# Patient Record
Sex: Male | Born: 1945 | Race: White | Hispanic: No | Marital: Married | Smoking: Never smoker
Health system: Southern US, Community
[De-identification: ages and names within clinical notes are randomized; demographics above are authoritative.]

## PROBLEM LIST (undated history)

## (undated) DIAGNOSIS — N201 Calculus of ureter: Secondary | ICD-10-CM

## (undated) DIAGNOSIS — Z9989 Dependence on other enabling machines and devices: Secondary | ICD-10-CM

## (undated) DIAGNOSIS — Z8639 Personal history of other endocrine, nutritional and metabolic disease: Secondary | ICD-10-CM

## (undated) DIAGNOSIS — J302 Other seasonal allergic rhinitis: Secondary | ICD-10-CM

## (undated) DIAGNOSIS — Z974 Presence of external hearing-aid: Secondary | ICD-10-CM

## (undated) DIAGNOSIS — E785 Hyperlipidemia, unspecified: Secondary | ICD-10-CM

## (undated) DIAGNOSIS — N200 Calculus of kidney: Secondary | ICD-10-CM

## (undated) DIAGNOSIS — M7550 Bursitis of unspecified shoulder: Secondary | ICD-10-CM

## (undated) DIAGNOSIS — Z8547 Personal history of malignant neoplasm of testis: Secondary | ICD-10-CM

## (undated) DIAGNOSIS — K219 Gastro-esophageal reflux disease without esophagitis: Secondary | ICD-10-CM

## (undated) DIAGNOSIS — E119 Type 2 diabetes mellitus without complications: Secondary | ICD-10-CM

## (undated) DIAGNOSIS — I10 Essential (primary) hypertension: Secondary | ICD-10-CM

## (undated) DIAGNOSIS — Z87442 Personal history of urinary calculi: Secondary | ICD-10-CM

## (undated) DIAGNOSIS — G4733 Obstructive sleep apnea (adult) (pediatric): Secondary | ICD-10-CM

## (undated) HISTORY — PX: PARATHYROIDECTOMY: SHX19

---

## 1979-08-17 HISTORY — PX: PERCUTANEOUS NEPHROSTOLITHOTOMY: SHX2207

## 1990-12-16 HISTORY — PX: ORCHIECTOMY: SHX2116

## 2006-12-16 HISTORY — PX: CATARACT EXTRACTION W/ INTRAOCULAR LENS  IMPLANT, BILATERAL: SHX1307

## 2011-05-14 ENCOUNTER — Emergency Department (HOSPITAL_COMMUNITY)

## 2011-05-14 ENCOUNTER — Emergency Department (HOSPITAL_COMMUNITY)
Admission: EM | Admit: 2011-05-14 | Discharge: 2011-05-14 | Disposition: A | Discharge status: Home / Self Care | Attending: Emergency Medicine | Admitting: Emergency Medicine

## 2011-05-14 DIAGNOSIS — R51 Headache: Secondary | ICD-10-CM | POA: Insufficient documentation

## 2011-05-14 DIAGNOSIS — I10 Essential (primary) hypertension: Secondary | ICD-10-CM | POA: Insufficient documentation

## 2011-05-14 DIAGNOSIS — M79609 Pain in unspecified limb: Secondary | ICD-10-CM | POA: Insufficient documentation

## 2011-05-14 DIAGNOSIS — R209 Unspecified disturbances of skin sensation: Secondary | ICD-10-CM | POA: Insufficient documentation

## 2011-05-14 DIAGNOSIS — Z79899 Other long term (current) drug therapy: Secondary | ICD-10-CM | POA: Insufficient documentation

## 2011-05-14 DIAGNOSIS — E7800 Pure hypercholesterolemia, unspecified: Secondary | ICD-10-CM | POA: Insufficient documentation

## 2011-05-14 LAB — COMPREHENSIVE METABOLIC PANEL
AST: 18 U/L (ref 0–37)
Albumin: 3.9 g/dL (ref 3.5–5.2)
Alkaline Phosphatase: 64 U/L (ref 39–117)
BUN: 17 mg/dL (ref 6–23)
Creatinine, Ser: 0.82 mg/dL (ref 0.4–1.5)
GFR calc Af Amer: >60 mL/min (ref >60)
Potassium: 3.6 mEq/L (ref 3.5–5.1)
Total Protein: 7 g/dL (ref 6.0–8.3)

## 2011-05-14 LAB — DIFFERENTIAL
Basophils Absolute: 0 10*3/uL (ref 0.0–0.1)
Basophils Relative: 0 % (ref 0–1)
Monocytes Absolute: 1 10*3/uL (ref 0.1–1.0)
Neutro Abs: 5.2 10*3/uL (ref 1.7–7.7)
Neutrophils Relative %: 61 % (ref 43–77)

## 2011-05-14 LAB — CBC
Hemoglobin: 15.8 g/dL (ref 13.0–17.0)
MCHC: 34.3 g/dL (ref 30.0–36.0)
WBC: 8.6 10*3/uL (ref 4.0–10.5)

## 2011-05-14 LAB — PROTIME-INR
INR: 0.96 (ref 0.00–1.49)
Prothrombin Time: 13 seconds (ref 11.6–15.2)

## 2011-05-14 MED ORDER — GADOBENATE DIMEGLUMINE 529 MG/ML IV SOLN
20.0000 mL | Freq: Once | INTRAVENOUS | Status: AC | PRN
  Administered 2011-05-14: 20 mL via INTRAVENOUS

## 2011-05-20 NOTE — Consult Note (Signed)
NAME:  Jerry Mcneil, TREAT NO.:  [id number]  MEDICAL RECORD NO.:  [id number]           PATIENT TYPE:  E  LOCATION:  [facility]                         FACILITY:  [facility]  PHYSICIAN:  Marlan Palau, M.D.  DATE OF BIRTH:  Dec 14, 1946  DATE OF CONSULTATION:  05/14/2011 DATE OF DISCHARGE:  05/14/2011                                CONSULTATION   HISTORY OF PRESENT ILLNESS:  Jerry Mcneil is a 65 year old right- handed white male born on 02-27-1946, with a history of hypertension and dyslipidemia.  This patient comes to the [facility] for evaluation of a possible stroke event.  This patient had onset of pain in the left arm and shoulder going up into the left neck associated with headache on the left side that began around 3:30 p.m. today.  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  The patient reported chest pains, shortness of breath as well.  The patient had pain down the left arm to the fingers, weakness of the left arm.  The patient went to the emergency room for an evaluation.  The patient claims while in the emergency room he developed some left facial weakness, slurred speech, left leg weakness and numbness and left leg pain.  The patient also complained of some dizziness.  This patient claims that since in the emergency room that his left-sided leg weakness has improved.  The patient continued to complain of left-sided chest pains, left neck pain, left arm pain, and pain in the left leg.  MRI of the brain has been done that by my reading appears to be completely unremarkable without acute changes seen.  MRA of the head and neck was done and appears to be completely normal.  This patient is being seen by Neurology for further evaluation.  PAST MEDICAL HISTORY:  Significant for: 1. History of new onset left-sided pain in the arm and leg with the     left hemisensory deficit with the face, arm, and  leg. 2. Diabetes, diet controlled. 3. Hypertension. 4. Dyslipidemia. 5. Decreased auditory acuity with hearing aid. 6. Parathyroidectomy. 7. Cataract surgery.  MEDICATIONS:  Notable for hydrochlorothiazide.  The patient is on a blood pressure pill he cannot remember the name of and is on a cholesterol medication he cannot remember the name of.  The patient is not on aspirin.  The patient notes an allergy to IVP DYE.  SOCIAL HISTORY:  This patient is married, [place], has three children who are alive and well.  The patient again works as a Orthoptist for [employer], does not smoke or drink.  FAMILY MEDICAL HISTORY:  Mother is living, age 67 with heart problems. Father died with cancer.  The patient has one brother who has had a pacemaker, one sister with degenerative arthritis and fibromyalgia, obesity.  REVIEW OF SYSTEMS:  Notable for no recent fevers, chills.  The patient does note some headache today and left neck pain, shortness of breath, chest pain is on the left.  The patient notes some nausea, no  vomiting, some loose bowels.  No problems controlling the bladder.  Denies any blackout episodes.  PHYSICAL EXAMINATION:  VITAL SIGNS:  Blood pressure is 113/70, heart rate is 59, respiratory rate 14, temperature afebrile. GENERAL:  The patient is a minimally obese white male who is alert and cooperative at the time of examination. HEENT:  Head is atraumatic.  Eyes:  Pupils are equal, round, and reactive to light. NECK:  Supple.  No carotid bruits noted. RESPIRATORY:  Clear. CARDIOVASCULAR:  Regular rate and rhythm.  No obvious murmurs, rubs noted. EXTREMITIES:  Without significant edema. NEUROLOGIC:  Cranial nerves as above.  Facial symmetry is present.  The patient has good sensation of the face on the cheeks bilaterally, decreased pinprick sensation on the left forehead is greater than right. Vibratory sensation is symmetric  on the forehead.  Again, extraocular movements are full, visual fields are full.  No aphasia or dysarthria is noted.  Motor testing reveals poor motor effort with the left arm and leg.  The patient has prominent pain displays when using the left arm and leg, slow movements with finger-nose-finger and heel-to-shin on the left and normal on the right.  The patient notes decreased pinprick sensation on left arm, left leg, it is greater on the right.  Vibration sensation is symmetric in the hands, decreased on the left foot as compared to the right.  The patient has some drift with the left arm and left legs.  On the right, the patient has symmetric reflexes.  Toes are neutral bilaterally.  The patient was not ambulated.  The patient reports some pain with elevation of the left leg and the left arm.  LABORATORY VALUES:  Notable for a white count of 8.6, hemoglobin of 15.8, hematocrit of 41.6, MCV of 90.6, platelets of 144, INR 0.96. Sodium 137, potassium 3.6, chloride 99, CO2 of 27, glucose 101, BUN 17, creatinine 0.82, calcium 9.9, total protein 7.0, albumin 3.9, AST of 18, ALT of 15.  The patient has a negative chest x-ray.  MRI and CT studies are as above.  Calcium level 9.9, albumin 3.9, total protein 7.0, INR of again 0.96.  IMPRESSION: 1. Left-sided pain syndrome, hemisensory deficit, weakness with     nonphysiologic examination. 2. Hypertension. 3. Diet controlled diabetes. 4. Dyslipidemia.  This patient does have some risk factors for stroke, but the MRI of the brain and MRA are unremarkable.  There is no evidence of a dissection of a vessel, and no evidence of stroke.  The patient now has had symptoms of left-sided weakness, numbness, and pain going on for over 7 hours with a normal MRI of the brain.  The patient likely has a nonphysiologic deficit.  The onset of the problem started with the left arm.  I suppose it is possible this patient may have sustained a nerve root  impingement syndrome in the neck and then later had embellishment of symptoms.  If the pain problem continues, I may consider an MRI of the cervical spine in the future.  At this point, from a neurologic standpoint, the patient is cleared for discharge home.  The patient can be followed through [facility] if needed.     Marlan Palau, M.D.     CKW/MEDQ  D:  05/14/2011  T:  05/15/2011  Job:  [id number]  cc:   Shanon Payor, MD [facility]  Electronically Signed by Thana Farr M.D. on 05/20/2011 09:30:53 AM

## 2012-12-16 HISTORY — PX: EXTRACORPOREAL SHOCK WAVE LITHOTRIPSY: SHX1557

## 2013-02-10 ENCOUNTER — Emergency Department (HOSPITAL_COMMUNITY): Payer: Medicare Other

## 2013-02-10 ENCOUNTER — Emergency Department (HOSPITAL_COMMUNITY)
Admission: EM | Admit: 2013-02-10 | Discharge: 2013-02-10 | Disposition: A | Discharge status: Home / Self Care | Payer: Medicare Other | Attending: Emergency Medicine | Admitting: Emergency Medicine

## 2013-02-10 ENCOUNTER — Encounter (HOSPITAL_COMMUNITY): Payer: Self-pay | Admitting: Nurse Practitioner

## 2013-02-10 DIAGNOSIS — I319 Disease of pericardium, unspecified: Secondary | ICD-10-CM | POA: Insufficient documentation

## 2013-02-10 DIAGNOSIS — R110 Nausea: Secondary | ICD-10-CM | POA: Insufficient documentation

## 2013-02-10 DIAGNOSIS — E785 Hyperlipidemia, unspecified: Secondary | ICD-10-CM | POA: Insufficient documentation

## 2013-02-10 DIAGNOSIS — F411 Generalized anxiety disorder: Secondary | ICD-10-CM | POA: Insufficient documentation

## 2013-02-10 DIAGNOSIS — R635 Abnormal weight gain: Secondary | ICD-10-CM | POA: Insufficient documentation

## 2013-02-10 DIAGNOSIS — R0602 Shortness of breath: Secondary | ICD-10-CM

## 2013-02-10 DIAGNOSIS — Z79899 Other long term (current) drug therapy: Secondary | ICD-10-CM | POA: Insufficient documentation

## 2013-02-10 DIAGNOSIS — R079 Chest pain, unspecified: Secondary | ICD-10-CM

## 2013-02-10 DIAGNOSIS — Z8639 Personal history of other endocrine, nutritional and metabolic disease: Secondary | ICD-10-CM | POA: Insufficient documentation

## 2013-02-10 DIAGNOSIS — E119 Type 2 diabetes mellitus without complications: Secondary | ICD-10-CM | POA: Insufficient documentation

## 2013-02-10 DIAGNOSIS — Z8679 Personal history of other diseases of the circulatory system: Secondary | ICD-10-CM | POA: Insufficient documentation

## 2013-02-10 DIAGNOSIS — I10 Essential (primary) hypertension: Secondary | ICD-10-CM | POA: Insufficient documentation

## 2013-02-10 DIAGNOSIS — R51 Headache: Secondary | ICD-10-CM | POA: Insufficient documentation

## 2013-02-10 DIAGNOSIS — Z87442 Personal history of urinary calculi: Secondary | ICD-10-CM | POA: Insufficient documentation

## 2013-02-10 DIAGNOSIS — Z862 Personal history of diseases of the blood and blood-forming organs and certain disorders involving the immune mechanism: Secondary | ICD-10-CM | POA: Insufficient documentation

## 2013-02-10 DIAGNOSIS — R0789 Other chest pain: Secondary | ICD-10-CM | POA: Insufficient documentation

## 2013-02-10 DIAGNOSIS — Z7982 Long term (current) use of aspirin: Secondary | ICD-10-CM | POA: Insufficient documentation

## 2013-02-10 HISTORY — DX: Hyperlipidemia, unspecified: E78.5

## 2013-02-10 HISTORY — DX: Other seasonal allergic rhinitis: J30.2

## 2013-02-10 HISTORY — DX: Essential (primary) hypertension: I10

## 2013-02-10 LAB — COMPREHENSIVE METABOLIC PANEL
ALT: 16 U/L (ref 0–53)
Alkaline Phosphatase: 49 U/L (ref 39–117)
CO2: 24 mEq/L (ref 19–32)
GFR calc Af Amer: 82 mL/min — ABNORMAL LOW (ref >90)
GFR calc non Af Amer: 71 mL/min — ABNORMAL LOW (ref >90)
Glucose, Bld: 121 mg/dL — ABNORMAL HIGH (ref 70–99)
Potassium: 4 mEq/L (ref 3.5–5.1)
Sodium: 142 mEq/L (ref 135–145)
Total Bilirubin: 0.5 mg/dL (ref 0.3–1.2)

## 2013-02-10 LAB — CBC WITH DIFFERENTIAL/PLATELET
Basophils Absolute: 0 10*3/uL (ref 0.0–0.1)
Basophils Relative: 0 % (ref 0–1)
MCHC: 33.6 g/dL (ref 30.0–36.0)
Monocytes Absolute: 0.7 10*3/uL (ref 0.1–1.0)
Neutro Abs: 4.4 10*3/uL (ref 1.7–7.7)
Neutrophils Relative %: 66 % (ref 43–77)
Platelets: 146 10*3/uL — ABNORMAL LOW (ref 150–400)
RDW: 13.9 % (ref 11.5–15.5)

## 2013-02-10 LAB — PROTIME-INR: INR: 1.02 (ref 0.00–1.49)

## 2013-02-10 LAB — URINALYSIS, ROUTINE W REFLEX MICROSCOPIC
Bilirubin Urine: NEGATIVE
Ketones, ur: NEGATIVE mg/dL
Nitrite: NEGATIVE
pH: 5.5 (ref 5.0–8.0)

## 2013-02-10 LAB — URINE MICROSCOPIC-ADD ON

## 2013-02-10 LAB — PRO B NATRIURETIC PEPTIDE: Pro B Natriuretic peptide (BNP): 264.1 pg/mL — ABNORMAL HIGH (ref 0–125)

## 2013-02-10 MED ORDER — GI COCKTAIL ~~LOC~~
30.0000 mL | Freq: Once | ORAL | Status: DC
  Filled 2013-02-10: qty 30

## 2013-02-10 MED ORDER — GI COCKTAIL ~~LOC~~
30.0000 mL | Freq: Once | ORAL | Status: AC
  Administered 2013-02-10: 30 mL via ORAL

## 2013-02-10 MED ORDER — ASPIRIN 81 MG PO CHEW
162.0000 mg | CHEWABLE_TABLET | Freq: Once | ORAL | Status: AC
  Administered 2013-02-10: 162 mg via ORAL
  Filled 2013-02-10: qty 1

## 2013-02-10 MED ORDER — MORPHINE SULFATE 4 MG/ML IJ SOLN
4.0000 mg | Freq: Once | INTRAMUSCULAR | Status: AC
  Administered 2013-02-10: 4 mg via INTRAVENOUS
  Filled 2013-02-10: qty 1

## 2013-02-10 NOTE — Progress Notes (Signed)
Support for [employer]. 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

## 2013-02-10 NOTE — ED Notes (Signed)
Removed supplemental oxygen and pt sating at 97% on RA.  Pt continuing to do calming breathing exercises.

## 2013-02-10 NOTE — ED Provider Notes (Signed)
History     CSN: [id number]  Arrival date & time 02/10/13  4098   First MD Initiated Contact with Patient 02/10/13 [phone]      Chief Complaint  Patient presents with  . 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  He states it started like he needed to belch.  He did not try tums. Then he had a sensation in his mid to left chest which was pressure like radiating to his left shoulder.  Pain 5/10.  He states he has bursitis of the left shoulder but this sensation is different.  Associated with sob at rest (new, while lying down), nausea without vomiting.  He took 2 baby Aspirin this morning.  He also reports increased weight gain. Normal weight is 199-205 pounds.    He reports h/a left side of head radiating down to his neck and left shoulder pain is 5/10.  He feels like someone knocked him in the head.    PMH: pericarditis (20s), kidney stone, HTN, dyslipidemia, DM, possible TIA/CVA FH: both grandfathers with MI, paternal grandfather died in 85s of MI, maternal GM MI, brother with heart problems Meds: simivastatin, HCTZ, enalapril, ASA 81 mg, Ibuprofen, Claritin  PCP: Dr. Anna Genre   The history is provided by the patient. No language interpreter was used.    Family History  Problem Relation Age of Onset  . Heart murmur Mother   . Cancer Father     History  Substance Use Topics  . Smoking status: Never Smoker   . Smokeless tobacco: Never Used  . Alcohol Use: No      Review of Systems  Constitutional: Positive for unexpected weight change.  Respiratory: Positive for shortness of breath.   Cardiovascular: Positive for chest pain. Negative for leg swelling.  Gastrointestinal: Negative for abdominal pain.  Genitourinary: Negative for dysuria.  Neurological: Positive for headaches.  Psychiatric/Behavioral: The patient is nervous/anxious.   All other systems reviewed and are  negative.    Allergies  Iohexol  Home Medications   Current Outpatient Rx  Name  Route  Sig  Dispense  Refill  . aspirin 81 MG tablet   Oral   Take 81 mg by mouth daily.         . enalapril (VASOTEC) 20 MG tablet   Oral   Take 20 mg by mouth daily.         . fexofenadine (ALLEGRA) 180 MG tablet   Oral   Take 180 mg by mouth daily.         . hydrochlorothiazide (HYDRODIURIL) 25 MG tablet   Oral   Take 25 mg by mouth daily.         Marland Kitchen ibuprofen (ADVIL,MOTRIN) 200 MG tablet   Oral   Take 200 mg by mouth every 6 (six) hours as needed for pain (pain).         . ranitidine (ZANTAC) 150 MG tablet   Oral   Take 150 mg by mouth daily as needed for heartburn.         . simvastatin (ZOCOR) 10 MG tablet   Oral   Take 10 mg by mouth at bedtime.           BP 117/75  Pulse 51  Temp(Src) 97.7 F (36.5 C) (Oral)  Resp 15  SpO2 98%  Physical Exam  Nursing note and vitals reviewed. Constitutional: He is oriented to  person, place, and time. Vital signs are normal. He appears well-developed and well-nourished. He is cooperative.  HENT:  Head: Normocephalic and atraumatic.  Mouth/Throat: Oropharynx is clear and moist and mucous membranes are normal. No oropharyngeal exudate.  Eyes: Conjunctivae are normal. Pupils are equal, round, and reactive to light. Right eye exhibits no discharge. Left eye exhibits no discharge. No scleral icterus.  Cardiovascular: Normal rate, regular rhythm, S1 normal, S2 normal and normal heart sounds.   No murmur heard. Pressure sensation increased with palpation  Pulmonary/Chest: Effort normal and breath sounds normal.  Abdominal: Soft. Bowel sounds are normal. He exhibits no distension. There is no tenderness.  Obese ab  Musculoskeletal: He exhibits no edema.       Left shoulder: He exhibits tenderness.  Left shoulder joint with mild ttp   Neurological: He is alert and oriented to person, place, and time. He has normal strength.   Skin: Skin is warm, dry and intact. No rash noted.  Psychiatric: He has a normal mood and affect. His speech is normal and behavior is normal. Judgment and thought content normal. Cognition and memory are normal.  Talkative     ED Course  Procedures (including critical care time)  Labs Reviewed  COMPREHENSIVE METABOLIC PANEL - Abnormal; Notable for the following:    Glucose, Bld 121 (*)    GFR calc non Af Amer 71 (*)    GFR calc Af Amer 82 (*)    All other components within normal limits  CBC WITH DIFFERENTIAL - Abnormal; Notable for the following:    Platelets 146 (*)    All other components within normal limits  PRO B NATRIURETIC PEPTIDE - Abnormal; Notable for the following:    Pro B Natriuretic peptide (BNP) 264.1 (*)    All other components within normal limits  URINALYSIS, ROUTINE W REFLEX MICROSCOPIC - Abnormal; Notable for the following:    Hgb urine dipstick SMALL (*)    Leukocytes, UA TRACE (*)    All other components within normal limits  URINE MICROSCOPIC-ADD ON - Abnormal; Notable for the following:    Bacteria, UA FEW (*)    All other components within normal limits  URINE CULTURE  TROPONIN I  PROTIME-INR  D-DIMER, QUANTITATIVE  TROPONIN I   Dg Chest 2 View  02/10/2013  *RADIOLOGY REPORT*  Clinical Data: Chest pain  CHEST - 2 VIEW  Comparison: 05/14/2011  Findings: There is no pleural effusion identified.  The lung volumes are decreased.  No pleural effusion or edema.  There is no airspace consolidation noted.  The review of the visualized osseous structures is unremarkable. Small hiatal hernia identified.  IMPRESSION:  1.  Low lung volumes. 2.  No pneumonia.   Original Report Authenticated By: Signa Kell, M.D.      1. Chest pain   2. SOB (shortness of breath)       Date: 02/10/2013  Rate: 60  Rhythm: normal sinus rhythm  QRS Axis: normal  Intervals: normal  ST/T Wave abnormalities: normal  Conduction Disutrbances:none  Narrative Interpretation:    Old EKG Reviewed: unchanged   MDM  Vitals on exam 57, 100%, 13, 137/70(85)  Chest pain with typical and atypical features with w/u for ACS/UA/angina, noncardiac (i.e GI/PE) Trop, bnp, CMET, cbc, inr, UA, d dimer TIMI Risk score at least 3 GI cocktail, Aspirin 162 as patient already took 162 mg today Symptoms relieved somewhat by GI cocktail and morphine Patient given the option for observation, patient declines he states he will  follow up with his PCP tomorrow Needs outpatient w/u i.e cardiac stress test, echo  [place] MD [phone]         Annett Gula, MD 02/10/13 1104  Annett Gula, MD 02/10/13 [phone]

## 2013-02-10 NOTE — ED Notes (Signed)
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  At 6AM, pt began feeling pressure in central chest that radiated to right shoulder: reports 5/10.

## 2013-02-10 NOTE — ED Provider Notes (Signed)
I saw and evaluated the patient, reviewed the resident's note and I agree with the findings and plan. I agree with resident's EKG interpretation.  Chest pressure radiating to L shoulder and back since 6 am.  Constant for 2.5 hours. Associated with SOB and nausea. Hx HTN, HLD. CTAB, RRR.  Chest wall and shoulder tender to palpation that duplicates symptoms. Equal grip strengths.  EKG nsr. Troponin negative.  Patient with some typical and atypical features for chest pain. Delta troponin negative. Recommend admission for observation rule out which patient declines stating he will follow up with his Dr. for stress test.  Glynn Octave, MD 02/10/13 1207

## 2013-02-10 NOTE — ED Notes (Signed)
Patient transported to X-ray 

## 2013-02-11 LAB — URINE CULTURE

## 2013-04-10 ENCOUNTER — Emergency Department (HOSPITAL_COMMUNITY): Payer: Medicare Other

## 2013-04-10 ENCOUNTER — Inpatient Hospital Stay (HOSPITAL_COMMUNITY)
Admission: EM | Admit: 2013-04-10 | Discharge: 2013-04-12 | DRG: 919 | Disposition: A | Discharge status: Home / Self Care | Payer: Medicare Other | Attending: Family Medicine | Admitting: Family Medicine

## 2013-04-10 ENCOUNTER — Encounter (HOSPITAL_COMMUNITY): Payer: Self-pay | Admitting: *Deleted

## 2013-04-10 DIAGNOSIS — R109 Unspecified abdominal pain: Secondary | ICD-10-CM

## 2013-04-10 DIAGNOSIS — N200 Calculus of kidney: Secondary | ICD-10-CM | POA: Diagnosis present

## 2013-04-10 DIAGNOSIS — Y838 Other surgical procedures as the cause of abnormal reaction of the patient, or of later complication, without mention of misadventure at the time of the procedure: Secondary | ICD-10-CM | POA: Diagnosis present

## 2013-04-10 DIAGNOSIS — I509 Heart failure, unspecified: Secondary | ICD-10-CM

## 2013-04-10 DIAGNOSIS — J189 Pneumonia, unspecified organism: Secondary | ICD-10-CM | POA: Diagnosis present

## 2013-04-10 DIAGNOSIS — J9819 Other pulmonary collapse: Secondary | ICD-10-CM | POA: Diagnosis present

## 2013-04-10 DIAGNOSIS — S37011A Minor contusion of right kidney, initial encounter: Secondary | ICD-10-CM

## 2013-04-10 DIAGNOSIS — S37011D Minor contusion of right kidney, subsequent encounter: Secondary | ICD-10-CM

## 2013-04-10 DIAGNOSIS — IMO0002 Reserved for concepts with insufficient information to code with codable children: Principal | ICD-10-CM | POA: Diagnosis present

## 2013-04-10 DIAGNOSIS — J690 Pneumonitis due to inhalation of food and vomit: Secondary | ICD-10-CM | POA: Diagnosis present

## 2013-04-10 DIAGNOSIS — E079 Disorder of thyroid, unspecified: Secondary | ICD-10-CM | POA: Diagnosis present

## 2013-04-10 DIAGNOSIS — N201 Calculus of ureter: Secondary | ICD-10-CM | POA: Diagnosis present

## 2013-04-10 DIAGNOSIS — E876 Hypokalemia: Secondary | ICD-10-CM | POA: Diagnosis present

## 2013-04-10 DIAGNOSIS — I10 Essential (primary) hypertension: Secondary | ICD-10-CM | POA: Diagnosis present

## 2013-04-10 DIAGNOSIS — Z79899 Other long term (current) drug therapy: Secondary | ICD-10-CM

## 2013-04-10 DIAGNOSIS — E785 Hyperlipidemia, unspecified: Secondary | ICD-10-CM | POA: Diagnosis present

## 2013-04-10 DIAGNOSIS — D72829 Elevated white blood cell count, unspecified: Secondary | ICD-10-CM | POA: Diagnosis present

## 2013-04-10 HISTORY — PX: TRANSTHORACIC ECHOCARDIOGRAM: SHX275

## 2013-04-10 LAB — URINALYSIS, ROUTINE W REFLEX MICROSCOPIC
Bilirubin Urine: NEGATIVE
Glucose, UA: NEGATIVE mg/dL
Specific Gravity, Urine: 1.034 — ABNORMAL HIGH (ref 1.005–1.030)
pH: 6.5 (ref 5.0–8.0)

## 2013-04-10 LAB — COMPREHENSIVE METABOLIC PANEL
ALT: 67 U/L — ABNORMAL HIGH (ref 0–53)
Alkaline Phosphatase: 72 U/L (ref 39–117)
BUN: 18 mg/dL (ref 6–23)
Chloride: 102 mEq/L (ref 96–112)
GFR calc Af Amer: >90 mL/min (ref >90)
Glucose, Bld: 165 mg/dL — ABNORMAL HIGH (ref 70–99)
Potassium: 4.1 mEq/L (ref 3.5–5.1)
Sodium: 135 mEq/L (ref 135–145)
Total Bilirubin: 0.9 mg/dL (ref 0.3–1.2)
Total Protein: 6.6 g/dL (ref 6.0–8.3)

## 2013-04-10 LAB — PROTIME-INR
INR: 1.23 (ref 0.00–1.49)
Prothrombin Time: 15.3 seconds — ABNORMAL HIGH (ref 11.6–15.2)

## 2013-04-10 LAB — CBC WITH DIFFERENTIAL/PLATELET
Basophils Relative: 0 % (ref 0–1)
HCT: 31.8 % — ABNORMAL LOW (ref 39.0–52.0)
Hemoglobin: 10.7 g/dL — ABNORMAL LOW (ref 13.0–17.0)
Lymphocytes Relative: 5 % — ABNORMAL LOW (ref 12–46)
Lymphs Abs: 0.6 10*3/uL — ABNORMAL LOW (ref 0.7–4.0)
MCHC: 33.6 g/dL (ref 30.0–36.0)
Monocytes Absolute: 1.5 10*3/uL — ABNORMAL HIGH (ref 0.1–1.0)
Monocytes Relative: 12 % (ref 3–12)
Neutro Abs: 10.2 10*3/uL — ABNORMAL HIGH (ref 1.7–7.7)
Neutrophils Relative %: 82 % — ABNORMAL HIGH (ref 43–77)
RBC: 3.55 MIL/uL — ABNORMAL LOW (ref 4.22–5.81)

## 2013-04-10 LAB — TSH: TSH: 0.692 u[IU]/mL (ref 0.350–4.500)

## 2013-04-10 LAB — LIPASE, BLOOD: Lipase: 26 U/L (ref 11–59)

## 2013-04-10 LAB — HEMOGLOBIN AND HEMATOCRIT, BLOOD: Hemoglobin: 9.8 g/dL — ABNORMAL LOW (ref 13.0–17.0)

## 2013-04-10 LAB — TYPE AND SCREEN
ABO/RH(D): A POS
Antibody Screen: NEGATIVE

## 2013-04-10 MED ORDER — OXYBUTYNIN CHLORIDE 5 MG PO TABS
5.0000 mg | ORAL_TABLET | Freq: Two times a day (BID) | ORAL | Status: DC
Start: 2013-04-10 — End: 2013-04-12
  Administered 2013-04-10 – 2013-04-12 (×5): 5 mg via ORAL
  Filled 2013-04-10 (×7): qty 1

## 2013-04-10 MED ORDER — LORATADINE 10 MG PO TABS
10.0000 mg | ORAL_TABLET | Freq: Every day | ORAL | Status: DC
  Administered 2013-04-10 – 2013-04-12 (×3): 10 mg via ORAL
  Filled 2013-04-10 (×3): qty 1

## 2013-04-10 MED ORDER — LACTATED RINGERS IV BOLUS (SEPSIS)
1000.0000 mL | Freq: Once | INTRAVENOUS | Status: AC
  Administered 2013-04-10: 1000 mL via INTRAVENOUS

## 2013-04-10 MED ORDER — TAMSULOSIN HCL 0.4 MG PO CAPS
0.4000 mg | ORAL_CAPSULE | Freq: Every day | ORAL | Status: DC
  Administered 2013-04-10 – 2013-04-12 (×3): 0.4 mg via ORAL
  Filled 2013-04-10 (×3): qty 1

## 2013-04-10 MED ORDER — ALBUTEROL SULFATE (5 MG/ML) 0.5% IN NEBU
2.5000 mg | INHALATION_SOLUTION | RESPIRATORY_TRACT | Status: DC | PRN
  Administered 2013-04-11: 2.5 mg via RESPIRATORY_TRACT
  Filled 2013-04-10: qty 0.5

## 2013-04-10 MED ORDER — FENTANYL 25 MCG/HR TD PT72
25.0000 ug | MEDICATED_PATCH | TRANSDERMAL | Status: DC
  Administered 2013-04-10: 25 ug via TRANSDERMAL
  Filled 2013-04-10: qty 1

## 2013-04-10 MED ORDER — ONDANSETRON HCL 4 MG/2ML IJ SOLN: 4.0000 mg | Freq: Four times a day (QID) | INTRAMUSCULAR | Status: DC | PRN

## 2013-04-10 MED ORDER — DEXTROSE 5 % IV SOLN
1.0000 g | Freq: Three times a day (TID) | INTRAVENOUS | Status: DC
  Administered 2013-04-10 – 2013-04-12 (×6): 1 g via INTRAVENOUS
  Filled 2013-04-10 (×9): qty 1

## 2013-04-10 MED ORDER — DIPHENHYDRAMINE HCL 50 MG/ML IJ SOLN: 50.0000 mg | Freq: Once | INTRAMUSCULAR | Status: AC

## 2013-04-10 MED ORDER — HYDROCORTISONE SOD SUCCINATE 100 MG IJ SOLR
100.0000 mg | Freq: Once | INTRAMUSCULAR | Status: AC
  Administered 2013-04-10: 100 mg via INTRAVENOUS
  Filled 2013-04-10: qty 2

## 2013-04-10 MED ORDER — POTASSIUM CHLORIDE IN NACL 20-0.9 MEQ/L-% IV SOLN
INTRAVENOUS | Status: AC
  Administered 2013-04-10: 12:00:00 via INTRAVENOUS
  Filled 2013-04-10 (×2): qty 1000

## 2013-04-10 MED ORDER — SIMVASTATIN 10 MG PO TABS
10.0000 mg | ORAL_TABLET | Freq: Every day | ORAL | Status: DC
  Administered 2013-04-10 – 2013-04-11 (×2): 10 mg via ORAL
  Filled 2013-04-10 (×3): qty 1

## 2013-04-10 MED ORDER — ACETAMINOPHEN 325 MG PO TABS: 650.0000 mg | ORAL_TABLET | Freq: Four times a day (QID) | ORAL | Status: DC | PRN

## 2013-04-10 MED ORDER — LEVOFLOXACIN IN D5W 750 MG/150ML IV SOLN
750.0000 mg | Freq: Once | INTRAVENOUS | Status: AC
  Administered 2013-04-10: 750 mg via INTRAVENOUS
  Filled 2013-04-10: qty 150

## 2013-04-10 MED ORDER — IOHEXOL 300 MG/ML  SOLN
100.0000 mL | Freq: Once | INTRAMUSCULAR | Status: AC | PRN
  Administered 2013-04-10: 100 mL via INTRAVENOUS

## 2013-04-10 MED ORDER — HYDROMORPHONE HCL PF 1 MG/ML IJ SOLN
1.0000 mg | Freq: Once | INTRAMUSCULAR | Status: AC
  Administered 2013-04-10: 1 mg via INTRAVENOUS
  Filled 2013-04-10: qty 1

## 2013-04-10 MED ORDER — VANCOMYCIN HCL IN DEXTROSE 1-5 GM/200ML-% IV SOLN
1000.0000 mg | Freq: Three times a day (TID) | INTRAVENOUS | Status: DC
Start: 2013-04-10 — End: 2013-04-12
  Administered 2013-04-10 – 2013-04-12 (×5): 1000 mg via INTRAVENOUS
  Filled 2013-04-10 (×8): qty 200

## 2013-04-10 MED ORDER — DIPHENHYDRAMINE HCL 50 MG/ML IJ SOLN
50.0000 mg | Freq: Once | INTRAMUSCULAR | Status: AC
  Administered 2013-04-10: 50 mg via INTRAVENOUS
  Filled 2013-04-10: qty 1

## 2013-04-10 MED ORDER — GUAIFENESIN 100 MG/5ML PO SOLN
10.0000 mL | ORAL | Status: DC | PRN
  Administered 2013-04-11: 200 mg via ORAL
  Filled 2013-04-10: qty 10

## 2013-04-10 MED ORDER — ACETAMINOPHEN 650 MG RE SUPP: 650.0000 mg | Freq: Four times a day (QID) | RECTAL | Status: DC | PRN

## 2013-04-10 MED ORDER — MORPHINE SULFATE 2 MG/ML IJ SOLN
2.0000 mg | INTRAMUSCULAR | Status: DC | PRN
  Administered 2013-04-10: 2 mg via INTRAVENOUS
  Filled 2013-04-10 (×2): qty 1

## 2013-04-10 MED ORDER — ONDANSETRON HCL 4 MG PO TABS: 4.0000 mg | ORAL_TABLET | Freq: Four times a day (QID) | ORAL | Status: DC | PRN

## 2013-04-10 MED ORDER — PROMETHAZINE HCL 25 MG/ML IJ SOLN
25.0000 mg | Freq: Once | INTRAMUSCULAR | Status: AC
  Administered 2013-04-10: 25 mg via INTRAVENOUS
  Filled 2013-04-10: qty 1

## 2013-04-10 MED ORDER — VANCOMYCIN HCL IN DEXTROSE 1-5 GM/200ML-% IV SOLN
1000.0000 mg | INTRAVENOUS | Status: AC
  Administered 2013-04-10: 1000 mg via INTRAVENOUS
  Filled 2013-04-10: qty 200

## 2013-04-10 MED ORDER — HYDROCODONE-ACETAMINOPHEN 5-325 MG PO TABS
1.0000 | ORAL_TABLET | ORAL | Status: DC | PRN
  Administered 2013-04-10: 2 via ORAL
  Administered 2013-04-10: 1 via ORAL
  Administered 2013-04-11: 2 via ORAL
  Filled 2013-04-10: qty 1
  Filled 2013-04-10 (×2): qty 2

## 2013-04-10 MED ORDER — POLYETHYLENE GLYCOL 3350 17 G PO PACK
17.0000 g | PACK | Freq: Every day | ORAL | Status: DC | PRN
  Filled 2013-04-10: qty 1

## 2013-04-10 MED ORDER — SODIUM CHLORIDE 0.9 % IJ SOLN
3.0000 mL | Freq: Two times a day (BID) | INTRAMUSCULAR | Status: DC
  Administered 2013-04-11: 3 mL via INTRAVENOUS

## 2013-04-10 MED ORDER — LEVOFLOXACIN IN D5W 750 MG/150ML IV SOLN
750.0000 mg | INTRAVENOUS | Status: AC
  Administered 2013-04-11 – 2013-04-12 (×2): 750 mg via INTRAVENOUS
  Filled 2013-04-10 (×2): qty 150

## 2013-04-10 MED ORDER — GUAIFENESIN-DM 100-10 MG/5ML PO SYRP: 5.0000 mL | ORAL_SOLUTION | ORAL | Status: DC | PRN

## 2013-04-10 MED ORDER — LEVOFLOXACIN IN D5W 750 MG/150ML IV SOLN: 750.0000 mg | INTRAVENOUS | Status: DC

## 2013-04-10 MED ORDER — DEXTROSE 5 % IV SOLN
2.0000 g | Freq: Once | INTRAVENOUS | Status: AC
  Administered 2013-04-10: 2 g via INTRAVENOUS
  Filled 2013-04-10: qty 2

## 2013-04-10 MED ORDER — FAMOTIDINE IN NACL 20-0.9 MG/50ML-% IV SOLN
20.0000 mg | Freq: Two times a day (BID) | INTRAVENOUS | Status: DC
  Administered 2013-04-10 – 2013-04-11 (×4): 20 mg via INTRAVENOUS
  Filled 2013-04-10 (×7): qty 50

## 2013-04-10 NOTE — ED Provider Notes (Addendum)
History     CSN: [id number]  Arrival date & time 04/10/13  0029   First MD Initiated Contact with Patient 04/10/13 0141      Chief Complaint  Patient presents with  . Flank Pain  . Shoulder Pain   HPI Jerry Mcneil is a 67 y.o. 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 Patient has a significant medical history of bilateral renal lithiasis and was treated in [place] with lithotripsy about a week ago. Patient had some residual stones on the right, and had a stent placed as well. Patient was found to have a right-sided subcapsular renal hematoma for which she was hospitalized and monitored. Patient says today he started having difficulty breathing, it hurts to take a deep breath especially on the right and in the right upper quadrant, it is a grabbing, twisting sensation it is 10 out of 10, he's had some coughing. Patient saw primary care physician and a chest x-ray was given a prescription for azithromycin for a right lower lobe pneumonia. Patient arrives to the ER secondary to pain primarily.  Denies dysuria, frequency.   Past Medical History  Diagnosis Date  . Hyperlipidemia   . Hypertension   . Seasonal allergies   . Bursitis of left shoulder   . Bursitis of right shoulder   . Hearing impaired   . Thyroid disease     Past Surgical History  Procedure Laterality Date  . Hotel manager  . Fluid aspirated from heart    . Eye surgery    . Testicular cancer    . Parathyroidectomy      Partial  . Thyroidectomy, partial      Family History  Problem Relation Age of Onset  . Heart murmur Mother   . Cancer Father     History  Substance Use Topics  . Smoking status: Never Smoker   . Smokeless tobacco: Never Used  . Alcohol Use: No      Review of Systems At least 10pt or greater review of systems completed and are negative except where specified in the HPI.  Allergies   Iohexol  Home Medications   Current Outpatient Rx  Name  Route  Sig  Dispense  Refill  . aspirin 81 MG tablet   Oral   Take 81 mg by mouth daily.         Marland Kitchen azithromycin (ZITHROMAX) 250 MG tablet   Oral   Take 250 mg by mouth daily.         Marland Kitchen guaiFENesin (ROBITUSSIN) 100 MG/5ML SOLN   Oral   Take 10 mLs by mouth every 4 (four) hours as needed. For cough         . loratadine (CLARITIN) 10 MG tablet   Oral   Take 10 mg by mouth daily.         Marland Kitchen lovastatin (MEVACOR) 10 MG tablet   Oral   Take 10 mg by mouth at bedtime.         Marland Kitchen oxybutynin (DITROPAN) 5 MG tablet   Oral   Take 5 mg by mouth 2 (two) times daily as needed. For bladder spasms         . oxyCODONE-acetaminophen (PERCOCET/ROXICET) 5-325 MG per tablet   Oral   Take 1 tablet by mouth every 4 (four) hours as needed for pain.         . ranitidine (ZANTAC) 150  MG tablet   Oral   Take 150 mg by mouth daily as needed for heartburn.         . simvastatin (ZOCOR) 10 MG tablet   Oral   Take 10 mg by mouth at bedtime.         . tamsulosin (FLOMAX) 0.4 MG CAPS   Oral   Take 0.4 mg by mouth daily.         . traMADol (ULTRAM) 50 MG tablet   Oral   Take 50 mg by mouth every 6 (six) hours as needed for pain.           BP 141/61  Pulse 84  Temp(Src) 97.8 F (36.6 C) (Oral)  Resp 20  SpO2 93%  Physical Exam  Nursing notes reviewed.  Electronic medical record reviewed. VITAL SIGNS:   Filed Vitals:   04/10/13 0036 04/10/13 0648  BP: 141/61 117/65  Pulse: 84 83  Temp: 97.8 F (36.6 C)   TempSrc: Oral   Resp: 20 21  SpO2: 93% 96%   CONSTITUTIONAL: Awake, oriented x4, appears ill HENT: Atraumatic, normocephalic, oral mucosa pink and moist, airway patent. Nares patent without drainage. External ears normal. EYES: Conjunctiva clear, EOMI, PERRLA NECK: Trachea midline, non-tender, supple CARDIOVASCULAR: Normal heart rate, Normal rhythm, No murmurs, rubs, gallops PULMONARY/CHEST: Clear  to auscultation, no rhonchi, wheezes, or rales. Symmetrical breath sounds. Non-tender. ABDOMINAL: Non-distended, obese, soft, tenderness to palpation in the right upper quadrant. Patient is sensitive to bed movements. BS normal. NEUROLOGIC: Non-focal, moving all four extremities, no gross sensory or motor deficits. EXTREMITIES: No clubbing, cyanosis, or edema SKIN: Warm, Dry, No erythema, No rash  ED Course  [place] bedside Date/Time: 04/10/2013 12:45 AM Performed by: Jones Skene Authorized by: Jones Skene Consent: Verbal consent obtained. Comments: Bedside ultrasound shows a large hypoechoic region compressing the right kidney presumably the hematoma. There seems to be some fluid in the inferior aspect of the right lung.   (including critical care time)  Date: 04/10/2013  Rate: 85  Rhythm: normal sinus rhythm  QRS Axis: normal  Intervals: normal  ST/T Wave abnormalities: T-wave inversion lead 3  Conduction Disutrbances: none  Narrative Interpretation: Unchanged from prior EKG in 02/10/2013 with the exception of a T-wave inversion in lead 3   Labs Reviewed  CBC WITH DIFFERENTIAL - Abnormal; Notable for the following:    WBC 12.4 (*)    RBC 3.55 (*)    Hemoglobin 10.7 (*)    HCT 31.8 (*)    Neutrophils Relative 82 (*)    Neutro Abs 10.2 (*)    Lymphocytes Relative 5 (*)    Lymphs Abs 0.6 (*)    Monocytes Absolute 1.5 (*)    All other components within normal limits  URINALYSIS, ROUTINE W REFLEX MICROSCOPIC - Abnormal; Notable for the following:    Specific Gravity, Urine 1.034 (*)    Hgb urine dipstick TRACE (*)    All other components within normal limits  COMPREHENSIVE METABOLIC PANEL - Abnormal; Notable for the following:    Glucose, Bld 165 (*)    Albumin 3.0 (*)    AST 69 (*)    ALT 67 (*)    GFR calc non Af Amer 86 (*)    All other components within normal limits  CULTURE, BLOOD (ROUTINE X 2)  CULTURE, BLOOD (ROUTINE X 2)  LIPASE, BLOOD  URINE MICROSCOPIC-ADD  ON   Dg Chest 2 View  04/10/2013  *RADIOLOGY REPORT*  Clinical Data: Possible right lower lobe  pneumonia.  Lethargic.  CHEST - 2 VIEW  Comparison: 02/10/2013.  Findings: Shallow inspiration.  There is interval development of infiltration or atelectasis in both lung bases.  Pneumonia is not excluded.  Heart size and pulmonary vascularity are prominent but likely normal for technique.  No blunting of costophrenic angles. No pneumothorax.  Mediastinal contours appear intact.  Normal alignment of thoracic vertebrae.  IMPRESSION: Shallow inspiration with infiltration or atelectasis in both lung bases.   Original Report Authenticated By: Burman Nieves, M.D.    Ct Abdomen Pelvis W Contrast  04/10/2013  *RADIOLOGY REPORT*  Clinical Data: Right flank pain after recent lithotripsy. Lithotripsy last week complicated by a hematoma.  Vomiting.  CT ABDOMEN AND PELVIS WITH CONTRAST  Technique:  Multidetector CT imaging of the abdomen and pelvis was performed following the standard protocol during bolus administration of intravenous contrast.  Contrast: OMNIPAQUE IOHEXOL 300 MG/ML  SOLN  Comparison: None.  Findings: There is atelectasis or consolidation in both lung bases with small pleural effusions bilaterally.  There is a small esophageal hiatal hernia.  There is a large subcapsular hematoma involving the right kidney and measuring approximately 16.8 x 12 x 6.2 cm.  There is also hemorrhage infiltrating in the pararenal fat on the right as well as in the pararenal enter fascial spaces.  Fluid extends along the right pericolic gutter.  There is increased density of the hematoma consistent with acute hematoma.  There is no evidence of contrast extravasation to suggest active hemorrhage.  There is some compression of the right renal parenchyma.  The renal nephrograms appear symmetrical and there is symmetrical appearance of contrast material in the renal collecting systems.  There is right-sided pyelocaliectasis and  ureterectasis with stone fragments demonstrated in the proximal right ureter just below the ureteropelvic junction at the level of L3.  The largest stone fragment measures about 4 mm in there appear to be three stone fragments in all.  No obstructing stones are demonstrated in the left kidney or ureter.  The bladder wall is not thickened and no bladder stones are visualized.  The liver, spleen, gallbladder, pancreas, adrenal glands, inferior vena cava, retroperitoneal lymph nodes, stomach, small bowel, and colon are otherwise unremarkable.  Calcification of the aorta without aneurysm.  No free air in the abdomen.  Pelvis:  Prostate gland is not enlarged.  There is a right inguinal hernia containing fat.  The appendix is normal.  No diverticulitis. Normal alignment of the lumbar vertebrae.  IMPRESSION: Large subcapsular hematoma in the right kidney with peri and pararenal hemorrhage as well.  No active extravasation demonstrated.  Moderately obstructing stone fragments in the proximal right ureter, largest measuring about 4 mm.  Infiltration or atelectasis in both lung bases with small left pleural effusions.   Original Report Authenticated By: Burman Nieves, M.D.      1. Hyperlipidemia   2. Hypertension   3. Thyroid disease       MDM  Patient referred to the ER after chest x-ray was suggestive of right lower lobe pneumonia. Had lithotripsy last week also had stents into right ureter - pain presents with worsening pain over the last 2 days especially today, associated with nausea vomiting. Patient was given azithromycin and took ciprofloxacin (2 doses) prior to arrival. Patient said character of the pain has changed in the right upper quadrant it is now severely twisting and grasping there is concern for a worsening subcapsular renal hematoma that was seen on prior studies.  We'll check basic labs,  obtain blood cultures, treat the patient for community-acquired pneumonia, as well as covering for urinary  pathogens. I do not think the patient needs to be covered for healthcare associated pneumonia-in fact I think he likely has pleural effusion secondary to irritation secondary to the worsening hematoma.  Discussed with radiologist Dr. Andria Meuse, we'll put patient on short pretreatment regimen of 100 mg hydrocortisone as well as 50 mg Benadryl by IV one hour prior to CT with IV contrast to prevent any anaphylactoid reaction.  Patient's primary care physician is Dr. Lorenso Courier here in [place]. Patient had been followed up with [facility] Dr. Leanord Asal who is his urologist and performed his lithotripsy and stenting of his ureters, phone [phone] in [place].  Patient has some elevated LFTs probably secondary to hematoma, hemoglobin is 10.7 today which is about 4 g lower than it had been on a prior visit. Patient has had antibiotics, he's been aggressively treated for pain and feels much better at this point. CT does show residual right-sided intraureteral stone fragments measuring 4 mm at the most, there is about 3 fragments.  A hematoma is a fairly large, 16.8 x 12 x 6.2 cm - there are multiple different densities suggesting acute on chronic bleeding, however radiologist is negative, there is no active bleeding with contrast extravasation.  Discussed case with Dr. Laverle Patter who will consult.  Patient will require admission for pain control, observation of the hematoma, follow H&H, follow his cultures, respiratory toilet.      Jones Skene, MD 04/10/13 1478  Jones Skene, MD 04/10/13 2956  Jones Skene, MD 04/10/13 2130

## 2013-04-10 NOTE — ED Notes (Signed)
Hospitalist at bedside 

## 2013-04-10 NOTE — ED Notes (Signed)
Pt states had lithotripsy last week, ended up having hematoma on kidney w/ vomiting, admitted to hospital Friday, discharged Sunday, had stent placed and removed yesterday, went to doctor today for trouble breathing, chest xray showed RLL pna, given Zpack. Pt complaining of R shoulder down to R flank area pain, states it's severe.

## 2013-04-10 NOTE — Progress Notes (Signed)
ANTIBIOTIC CONSULT NOTE - INITIAL  Pharmacy Consult for Vancomycin, Pharmacy may adjust abx Indication: pneumonia  Allergies  Allergen Reactions  . Iohexol Swelling    Patient Measurements:   Stated wt 204 lbs Vital Signs: Temp: 97.8 F (36.6 C) (04/26 0036) Temp src: Oral (04/26 0036) BP: 117/65 mmHg (04/26 0648) Pulse Rate: 83 (04/26 0648) Intake/Output from previous day:   Intake/Output from this shift:    Labs:  Recent Labs  04/10/13 0203  WBC 12.4*  HGB 10.7*  PLT 317  CREATININE 0.93   CrCl is unknown because there is no height on file for the current visit. No results found for this basename: VANCOTROUGH, VANCOPEAK, VANCORANDOM, GENTTROUGH, GENTPEAK, GENTRANDOM, TOBRATROUGH, TOBRAPEAK, TOBRARND, AMIKACINPEAK, AMIKACINTROU, AMIKACIN,  in the last 72 hours   Microbiology: No results found for this or any previous visit (from the past 720 hour(s)).  Medical History: Past Medical History  Diagnosis Date  . Hyperlipidemia   . Hypertension   . Seasonal allergies   . Bursitis of left shoulder   . Bursitis of right shoulder   . Hearing impaired   . Thyroid disease     Medications:  Anti-infectives   Start     Dose/Rate Route Frequency Ordered Stop   04/10/13 0800  ceFEPIme (MAXIPIME) 1 g in dextrose 5 % 50 mL IVPB     1 g 100 mL/hr over 30 Minutes Intravenous Every 8 hours 04/10/13 0748 04/18/13 0759   04/10/13 0800  levofloxacin (LEVAQUIN) IVPB 750 mg     750 mg 100 mL/hr over 90 Minutes Intravenous Every 24 hours 04/10/13 0748 04/13/13 0759   04/10/13 0200  cefTRIAXone (ROCEPHIN) 2 g in dextrose 5 % 50 mL IVPB     2 g 100 mL/hr over 30 Minutes Intravenous  Once 04/10/13 0153 04/10/13 0249   04/10/13 0200  levofloxacin (LEVAQUIN) IVPB 750 mg     750 mg 100 mL/hr over 90 Minutes Intravenous  Once 04/10/13 0153 04/10/13 0401     Assessment: 66 YOM admitted via ER 4/26. CXR outpt shows RLL pna. Was given zpack. Came to Er w/ R shoulder and R flank  pain. Had lithotripsy ~ 1 week ago. Pharmacy asked to dose Vancomycin x 8 days and adjust antibiotics for renal function. Pt has been ordered Levaquin 750mg  IV q24h x 3 days and Cefepime 1g IV q8h x 8 days  Scr wnl  Stated wt is 204 lbs  CrCl ~ 65ml/min/1.73m2   Goal of Therapy:  Vancomycin trough level 15-20 mcg/ml Appropriate doses of other abx based on renal fx Resolution of infx   Plan:   Vancomycin 1g IV x 1 now then 1g IV q8h  No change to doses of Cefepime and Levaquin  Follow labs, vitals and cultures  VT @ [facility]   Adjust doses as necessary  Gwen Her PharmD  [phone] 04/10/2013 8:06 AM

## 2013-04-10 NOTE — Progress Notes (Signed)
I was paged at eight am by the secretary in the ER asking me to visit Jerry Mcneil when I came to [facility]. At about eight thirty am, I spoke to both Jerry Mcneil and his wife letting them know I'd be at the [facility] soon. When I arrived at the ER he was being wheeled up to his room 1417.  We spoke briefly,and I waited till the nursing staff settled him in. 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 I will pass this on to the Administrative Chaplain. We spoke for a short while, then had a word of prayer.

## 2013-04-10 NOTE — ED Notes (Signed)
4e called for report, RN busy at this time 

## 2013-04-10 NOTE — H&P (Signed)
[facility]                                                                                    Patient Demographics  Jerry Mcneil, is a 67 y.o. male  CSN: [id number]  MRN: [id number]  DOB - 03-07-1946  Admit Date - 04/10/2013  Outpatient Primary MD for the patient is Provider Not In System   With History of -  Past Medical History  Diagnosis Date  . Hyperlipidemia   . Hypertension   . Seasonal allergies   . Bursitis of left shoulder   . Bursitis of right shoulder   . Hearing impaired   . Thyroid disease       Past Surgical History  Procedure Laterality Date  . Hotel manager  . Fluid aspirated from heart    . Eye surgery    . Testicular cancer    . Parathyroidectomy      Partial  . Thyroidectomy, partial      in for   Chief Complaint  Patient presents with  . Flank Pain  . Shoulder Pain     HPI  Jerry Mcneil  is a 67 y.o. male, with H/O HTN, Dyslipidemia, Kidney stones, who underwent lithotripsy at [facility] in [place] by Dr. Leanord Asal phone number [phone] about 7 days ago, his procedure was complicated by the formation of right renal hematoma, he was subsequently admitted to the hospital, she was reoperated a few days later by removal of a few ureteric stones and a basement of right ureteric stent, couple of days ago his right ureteric stent was removed and patient went home, however patient started experiencing excruciating right-sided flank pain and was unable to take deep breaths, he also threw up a few times and developed some shortness of breath, he visited his urologist where a chest x-ray showed possible right lower lobe infiltrate he was then asked to come to the ER.   In the ER workup suggestive of leukocytosis, possible HCAP, and a new CT scan showed right sided large subcapsular renal hematoma with some ureteric stones on the right side, case was discussed by the ER physician and me with urologist Dr. Laverle Patter who  recommended that patient be admitted by hospitalist and he would consult and follow the patient closely.    Review of Systems    In addition to the HPI above, No Fever-chills, No Headache, No changes with Vision or hearing, No problems swallowing food or Liquids, No Chest pain, + Cough, no Shortness of Breath at this time, +R. flank/ Abdominal pain, some nausea and vomiting yesterday, Bowel movements are regular, No Blood in stool  No dysuria, No new skin rashes or bruises, No new joints pains-aches,  No new weakness, tingling, numbness in any extremity, No recent weight gain or loss, No polyuria, polydypsia or polyphagia, No significant Mental Stressors.  A full 10 point Review of Systems was done, except as stated above, all other Review of Systems were negative.   Social History History  Substance Use Topics  . Smoking status: Never Smoker   . Smokeless tobacco: Never Used  . Alcohol Use: No  Family History Family History  Problem Relation Age of Onset  . Heart murmur Mother   . Cancer Father      Prior to Admission medications   Medication Sig Start Date End Date Taking? Authorizing Provider  aspirin 81 MG tablet Take 81 mg by mouth daily.   Yes Historical Provider, MD  azithromycin (ZITHROMAX) 250 MG tablet Take 250 mg by mouth daily.   Yes Historical Provider, MD  guaiFENesin (ROBITUSSIN) 100 MG/5ML SOLN Take 10 mLs by mouth every 4 (four) hours as needed. For cough   Yes Historical Provider, MD  loratadine (CLARITIN) 10 MG tablet Take 10 mg by mouth daily.   Yes Historical Provider, MD  lovastatin (MEVACOR) 10 MG tablet Take 10 mg by mouth at bedtime.   Yes Historical Provider, MD  oxybutynin (DITROPAN) 5 MG tablet Take 5 mg by mouth 2 (two) times daily as needed. For bladder spasms   Yes Historical Provider, MD  oxyCODONE-acetaminophen (PERCOCET/ROXICET) 5-325 MG per tablet Take 1 tablet by mouth every 4 (four) hours as needed for pain.   Yes Historical  Provider, MD  ranitidine (ZANTAC) 150 MG tablet Take 150 mg by mouth daily as needed for heartburn.   Yes Historical Provider, MD  simvastatin (ZOCOR) 10 MG tablet Take 10 mg by mouth at bedtime.   Yes Historical Provider, MD  tamsulosin (FLOMAX) 0.4 MG CAPS Take 0.4 mg by mouth daily.   Yes Historical Provider, MD  traMADol (ULTRAM) 50 MG tablet Take 50 mg by mouth every 6 (six) hours as needed for pain.   Yes Historical Provider, MD    Allergies  Allergen Reactions  . Iohexol Swelling    Physical Exam  Vitals  Blood pressure 117/65, pulse 83, temperature 97.8 F (36.6 C), temperature source Oral, resp. rate 21, SpO2 96.00%.   1. General middle aged white male lying in bed in NAD,    2. Normal affect and insight, Not Suicidal or Homicidal, Awake Alert, Oriented X 3.  3. No F.N deficits, ALL C.Nerves Intact, Strength 5/5 all 4 extremities, Sensation intact all 4 extremities, Plantars down going.  4. Ears and Eyes appear Normal, Conjunctivae clear, PERRLA. Moist Oral Mucosa.  5. Supple Neck, No JVD, No cervical lymphadenopathy appriciated, No Carotid Bruits.  6. Symmetrical Chest wall movement, Good air movement bilaterally, few basilar rales, mild R.flank tenderness  7. RRR, No Gallops, Rubs or Murmurs, No Parasternal Heave.  8. Positive Bowel Sounds, Abdomen Soft, Non tender, No organomegaly appriciated,No rebound -guarding or rigidity.  9.  No Cyanosis, Normal Skin Turgor, No Skin Rash or Bruise.  10. Good muscle tone,  joints appear normal , no effusions, Normal ROM.  11. No Palpable Lymph Nodes in Neck or Axillae    Data Review  CBC  Recent Labs Lab 04/10/13 0203  WBC 12.4*  HGB 10.7*  HCT 31.8*  PLT 317  MCV 89.6  MCH 30.1  MCHC 33.6  RDW 13.5  LYMPHSABS 0.6*  MONOABS 1.5*  EOSABS 0.0  BASOSABS 0.0   ------------------------------------------------------------------------------------------------------------------  Chemistries   Recent Labs Lab  04/10/13 0203  NA 135  K 4.1  CL 102  CO2 24  GLUCOSE 165*  BUN 18  CREATININE 0.93  CALCIUM 10.3  AST 69*  ALT 67*  ALKPHOS 72  BILITOT 0.9   ------------------------------------------------------------------------------------------------------------------ CrCl is unknown because there is no height on file for the current visit. ------------------------------------------------------------------------------------------------------------------ No results found for this basename: TSH, T4TOTAL, FREET3, T3FREE, THYROIDAB,  in the last 72  hours   Coagulation profile No results found for this basename: INR, PROTIME,  in the last 168 hours ------------------------------------------------------------------------------------------------------------------- No results found for this basename: DDIMER,  in the last 72 hours -------------------------------------------------------------------------------------------------------------------  Cardiac Enzymes No results found for this basename: CK, CKMB, TROPONINI, MYOGLOBIN,  in the last 168 hours ------------------------------------------------------------------------------------------------------------------ No components found with this basename: POCBNP,    ---------------------------------------------------------------------------------------------------------------  Urinalysis    Component Value Date/Time   COLORURINE YELLOW 04/10/2013 0514   APPEARANCEUR CLEAR 04/10/2013 0514   LABSPEC 1.034* 04/10/2013 0514   PHURINE 6.5 04/10/2013 0514   GLUCOSEU NEGATIVE 04/10/2013 0514   HGBUR TRACE* 04/10/2013 0514   BILIRUBINUR NEGATIVE 04/10/2013 0514   KETONESUR NEGATIVE 04/10/2013 0514   PROTEINUR NEGATIVE 04/10/2013 0514   UROBILINOGEN 1.0 04/10/2013 0514   NITRITE NEGATIVE 04/10/2013 0514   LEUKOCYTESUR NEGATIVE 04/10/2013 0514     ----------------------------------------------------------------------------------------------------------------  Imaging results:   Dg Chest 2 View  04/10/2013  *RADIOLOGY REPORT*  Clinical Data: Possible right lower lobe pneumonia.  Lethargic.  CHEST - 2 VIEW  Comparison: 02/10/2013.  Findings: Shallow inspiration.  There is interval development of infiltration or atelectasis in both lung bases.  Pneumonia is not excluded.  Heart size and pulmonary vascularity are prominent but likely normal for technique.  No blunting of costophrenic angles. No pneumothorax.  Mediastinal contours appear intact.  Normal alignment of thoracic vertebrae.  IMPRESSION: Shallow inspiration with infiltration or atelectasis in both lung bases.   Original Report Authenticated By: Burman Nieves, M.D.    Ct Abdomen Pelvis W Contrast  04/10/2013  *RADIOLOGY REPORT*  Clinical Data: Right flank pain after recent lithotripsy. Lithotripsy last week complicated by a hematoma.  Vomiting.  CT ABDOMEN AND PELVIS WITH CONTRAST  Technique:  Multidetector CT imaging of the abdomen and pelvis was performed following the standard protocol during bolus administration of intravenous contrast.  Contrast: OMNIPAQUE IOHEXOL 300 MG/ML  SOLN  Comparison: None.  Findings: There is atelectasis or consolidation in both lung bases with small pleural effusions bilaterally.  There is a small esophageal hiatal hernia.  There is a large subcapsular hematoma involving the right kidney and measuring approximately 16.8 x 12 x 6.2 cm.  There is also hemorrhage infiltrating in the pararenal fat on the right as well as in the pararenal enter fascial spaces.  Fluid extends along the right pericolic gutter.  There is increased density of the hematoma consistent with acute hematoma.  There is no evidence of contrast extravasation to suggest active hemorrhage.  There is some compression of the right renal parenchyma.  The renal nephrograms appear  symmetrical and there is symmetrical appearance of contrast material in the renal collecting systems.  There is right-sided pyelocaliectasis and ureterectasis with stone fragments demonstrated in the proximal right ureter just below the ureteropelvic junction at the level of L3.  The largest stone fragment measures about 4 mm in there appear to be three stone fragments in all.  No obstructing stones are demonstrated in the left kidney or ureter.  The bladder wall is not thickened and no bladder stones are visualized.  The liver, spleen, gallbladder, pancreas, adrenal glands, inferior vena cava, retroperitoneal lymph nodes, stomach, small bowel, and colon are otherwise unremarkable.  Calcification of the aorta without aneurysm.  No free air in the abdomen.  Pelvis:  Prostate gland is not enlarged.  There is a right inguinal hernia containing fat.  The appendix is normal.  No diverticulitis. Normal alignment of the lumbar vertebrae.  IMPRESSION: Large subcapsular hematoma  in the right kidney with peri and pararenal hemorrhage as well.  No active extravasation demonstrated.  Moderately obstructing stone fragments in the proximal right ureter, largest measuring about 4 mm.  Infiltration or atelectasis in both lung bases with small left pleural effusions.   Original Report Authenticated By: Burman Nieves, M.D.     My personal review of EKG: Rhythm NSR, no Acute ST changes    Assessment & Plan    1. Right-sided flank pain secondary to post lithotripsy right subcapsular hematoma and few right ureteric stones, pain cause nausea and vomiting and difficulty in taking deep breaths - patient feels much better with pain control, currently in no distress, case discussed with urologist Dr. Laverle Patter, he will be admitted to the hospital, gentle fluids and pain control, monitor H&H closely, type and screen, goal will be to keep hemoglobin above 8. Urology will follow the case closely.    2. Possible HCAP Secondary to  nausea vomiting yesterday and possible aspiration.- Blood cultures and sputum cultures, oxygen and nebulizer treatments as needed, currently pain and nausea free, currently no shortness of breath is pain control is better and he is able to take deep breaths, empiric antibiotics per protocol, will try to taper antibiotics rapidly within 24-48 hours judging clinical response.   3. History of dyslipidemia. No acute issues home starting to be continued.    4. History of hypertension. On no medications blood pressure stable monitor.    DVT Prophylaxis SCDs   AM Labs Ordered, also please review Full Orders  Family Communication: Admission, patients condition and plan of care including tests being ordered have been discussed with the patient and wife who indicate understanding and agree with the plan and Code Status.  Code Status full  Likely DC to  home  Time spent in minutes : 35  Condition Marinell Blight K M.D on 04/10/2013 at 7:54 AM  Between 7am to 7pm - Pager - [phone]  After 7pm go to [email or web address] - password [email or web address]  And look for the night coverage person covering me after hours  [facility] Office  [phone]

## 2013-04-10 NOTE — Consult Note (Signed)
Urology Consult   Physician requesting consult: Dr. Rulon Abide and Dr. Thedore Mins  Reason for consult: Right subcapsular hematoma and right ureteral calculus  History of Present Illness: Jerry Mcneil is a 67 y.o. who underwent SWL for a right ureteral calculus about 10 days ago by Dr. Leanord Asal in [place].  He developed worsening right flank pain last week and was admitted to [facility].  He was noted to have a perirenal hematoma at that time apparently and also was noted to have residual ureteral stone fragments and required ureteroscopic stone removal at that time. A ureteral stent was left in place postoperatively.  Following ureteral stent removal, he has now developed severe pain into his right shoulder and chest with associated dyspnea and right upper quadrant of the abdomen.  He has not been febrile and denies nausea and vomiting. His pain has not been controlled with Percocet.  He presented to the [facility] ED tonight with pain complaints as stated above. He underwent a CT scan of the abdomen which confirmed a large 16 x 12 cm right subcapsular renal hematoma and severe right hydronephrosis with multiple stone fragments in the proximal ureter including a 4 mm obstructing proximal ureteral stone noted. His CT also demonstrated bilateral small pleural effusions and findings suggestive of atelectasis vs. pneumonia. He has been off aspirin since one week prior to his lithotripsy.   Past Medical History  Diagnosis Date  . Hyperlipidemia   . Hypertension   . Seasonal allergies   . Bursitis of left shoulder   . Bursitis of right shoulder   . Hearing impaired   . Thyroid disease     Past Surgical History  Procedure Laterality Date  . Hotel manager  . Fluid aspirated from heart    . Eye surgery    . Testicular cancer    . Parathyroidectomy      Partial  . Thyroidectomy, partial      Current Hospital Medications:  Home Meds:    Medication List    ASK your doctor about  these medications       aspirin 81 MG tablet  Take 81 mg by mouth daily.     azithromycin 250 MG tablet  Commonly known as:  ZITHROMAX  Take 250 mg by mouth daily.     guaiFENesin 100 MG/5ML Soln  Commonly known as:  ROBITUSSIN  Take 10 mLs by mouth every 4 (four) hours as needed. For cough     loratadine 10 MG tablet  Commonly known as:  CLARITIN  Take 10 mg by mouth daily.     lovastatin 10 MG tablet  Commonly known as:  MEVACOR  Take 10 mg by mouth at bedtime.     oxybutynin 5 MG tablet  Commonly known as:  DITROPAN  Take 5 mg by mouth 2 (two) times daily as needed. For bladder spasms     oxyCODONE-acetaminophen 5-325 MG per tablet  Commonly known as:  PERCOCET/ROXICET  Take 1 tablet by mouth every 4 (four) hours as needed for pain.     ranitidine 150 MG tablet  Commonly known as:  ZANTAC  Take 150 mg by mouth daily as needed for heartburn.     simvastatin 10 MG tablet  Commonly known as:  ZOCOR  Take 10 mg by mouth at bedtime.     tamsulosin 0.4 MG Caps  Commonly known as:  FLOMAX  Take 0.4 mg by mouth daily.     traMADol 50 MG tablet  Commonly known as:  ULTRAM  Take 50 mg by mouth every 6 (six) hours as needed for pain.        Scheduled Meds:  Continuous Infusions: . ceFEPime (MAXIPIME) IV    . levofloxacin (LEVAQUIN) IV    . vancomycin 1,000 mg (04/10/13 0827)   PRN Meds:.  Allergies:  Allergies  Allergen Reactions  . Iohexol Swelling    Family History  Problem Relation Age of Onset  . Heart murmur Mother   . Cancer Father     Social History:  reports that he has never smoked. He has never used smokeless tobacco. He reports that he does not drink alcohol or use illicit drugs.  ROS: A complete review of systems was performed.  All systems are negative except for pertinent findings as noted.  Physical Exam:  Vital signs in last 24 hours: Temp:  [97.8 F (36.6 C)] 97.8 F (36.6 C) (04/26 0036) Pulse Rate:  [83-84] 83 (04/26  0648) Resp:  [20-21] 21 (04/26 0648) BP: (117-141)/(61-65) 117/65 mmHg (04/26 0648) SpO2:  [93 %-96 %] 96 % (04/26 0648) General:  Alert and oriented, No acute distress HEENT: Normocephalic, atraumatic Neck: No JVD or lymphadenopathy Cardiovascular: Regular rate and rhythm Lungs: Clear bilaterally Abdomen: Soft, nontender, mild RUQ tenderness. No rebound tenderness or guarding. Back: No CVA tenderness Extremities: No edema Neurologic: Grossly intact  Laboratory Data:   Recent Labs  04/10/13 0203  WBC 12.4*  HGB 10.7*  HCT 31.8*  PLT 317     Recent Labs  04/10/13 0203  NA 135  K 4.1  CL 102  GLUCOSE 165*  BUN 18  CALCIUM 10.3  CREATININE 0.93     Results for orders placed during the hospital encounter of 04/10/13 (from the past 24 hour(s))  CBC WITH DIFFERENTIAL     Status: Abnormal   Collection Time    04/10/13  2:03 AM      Result Value Range   WBC 12.4 (*) 4.0 - 10.5 K/uL   RBC 3.55 (*) 4.22 - 5.81 MIL/uL   Hemoglobin 10.7 (*) 13.0 - 17.0 g/dL   HCT 14.7 (*) 82.9 - 56.2 %   MCV 89.6  78.0 - 100.0 fL   MCH 30.1  26.0 - 34.0 pg   MCHC 33.6  30.0 - 36.0 g/dL   RDW 13.0  86.5 - 78.4 %   Platelets 317  150 - 400 K/uL   Neutrophils Relative 82 (*) 43 - 77 %   Neutro Abs 10.2 (*) 1.7 - 7.7 K/uL   Lymphocytes Relative 5 (*) 12 - 46 %   Lymphs Abs 0.6 (*) 0.7 - 4.0 K/uL   Monocytes Relative 12  3 - 12 %   Monocytes Absolute 1.5 (*) 0.1 - 1.0 K/uL   Eosinophils Relative 0  0 - 5 %   Eosinophils Absolute 0.0  0.0 - 0.7 K/uL   Basophils Relative 0  0 - 1 %   Basophils Absolute 0.0  0.0 - 0.1 K/uL  COMPREHENSIVE METABOLIC PANEL     Status: Abnormal   Collection Time    04/10/13  2:03 AM      Result Value Range   Sodium 135  135 - 145 mEq/L   Potassium 4.1  3.5 - 5.1 mEq/L   Chloride 102  96 - 112 mEq/L   CO2 24  19 - 32 mEq/L   Glucose, Bld 165 (*) 70 - 99 mg/dL   BUN 18  6 - 23 mg/dL  Creatinine, Ser 0.93  0.50 - 1.35 mg/dL   Calcium 16.1  8.4 - 09.6  mg/dL   Total Protein 6.6  6.0 - 8.3 g/dL   Albumin 3.0 (*) 3.5 - 5.2 g/dL   AST 69 (*) 0 - 37 U/L   ALT 67 (*) 0 - 53 U/L   Alkaline Phosphatase 72  39 - 117 U/L   Total Bilirubin 0.9  0.3 - 1.2 mg/dL   GFR calc non Af Amer 86 (*) >90 mL/min   GFR calc Af Amer >90  >90 mL/min  LIPASE, BLOOD     Status: None   Collection Time    04/10/13  2:03 AM      Result Value Range   Lipase 26  11 - 59 U/L  URINALYSIS, ROUTINE W REFLEX MICROSCOPIC     Status: Abnormal   Collection Time    04/10/13  5:14 AM      Result Value Range   Color, Urine YELLOW  YELLOW   APPearance CLEAR  CLEAR   Specific Gravity, Urine 1.034 (*) 1.005 - 1.030   pH 6.5  5.0 - 8.0   Glucose, UA NEGATIVE  NEGATIVE mg/dL   Hgb urine dipstick TRACE (*) NEGATIVE   Bilirubin Urine NEGATIVE  NEGATIVE   Ketones, ur NEGATIVE  NEGATIVE mg/dL   Protein, ur NEGATIVE  NEGATIVE mg/dL   Urobilinogen, UA 1.0  0.0 - 1.0 mg/dL   Nitrite NEGATIVE  NEGATIVE   Leukocytes, UA NEGATIVE  NEGATIVE  URINE MICROSCOPIC-ADD ON     Status: None   Collection Time    04/10/13  5:14 AM      Result Value Range   RBC / HPF 0-2  <3 RBC/hpf   No results found for this or any previous visit (from the past 240 hour(s)).  Renal Function:  Recent Labs  04/10/13 0203  CREATININE 0.93   CrCl is unknown because there is no height on file for the current visit.  Radiologic Imaging: Dg Chest 2 View  04/10/2013  *RADIOLOGY REPORT*  Clinical Data: Possible right lower lobe pneumonia.  Lethargic.  CHEST - 2 VIEW  Comparison: 02/10/2013.  Findings: Shallow inspiration.  There is interval development of infiltration or atelectasis in both lung bases.  Pneumonia is not excluded.  Heart size and pulmonary vascularity are prominent but likely normal for technique.  No blunting of costophrenic angles. No pneumothorax.  Mediastinal contours appear intact.  Normal alignment of thoracic vertebrae.  IMPRESSION: Shallow inspiration with infiltration or atelectasis  in both lung bases.   Original Report Authenticated By: Burman Nieves, M.D.    Ct Abdomen Pelvis W Contrast  04/10/2013  *RADIOLOGY REPORT*  Clinical Data: Right flank pain after recent lithotripsy. Lithotripsy last week complicated by a hematoma.  Vomiting.  CT ABDOMEN AND PELVIS WITH CONTRAST  Technique:  Multidetector CT imaging of the abdomen and pelvis was performed following the standard protocol during bolus administration of intravenous contrast.  Contrast: OMNIPAQUE IOHEXOL 300 MG/ML  SOLN  Comparison: None.  Findings: There is atelectasis or consolidation in both lung bases with small pleural effusions bilaterally.  There is a small esophageal hiatal hernia.  There is a large subcapsular hematoma involving the right kidney and measuring approximately 16.8 x 12 x 6.2 cm.  There is also hemorrhage infiltrating in the pararenal fat on the right as well as in the pararenal enter fascial spaces.  Fluid extends along the right pericolic gutter.  There is increased density of the hematoma  consistent with acute hematoma.  There is no evidence of contrast extravasation to suggest active hemorrhage.  There is some compression of the right renal parenchyma.  The renal nephrograms appear symmetrical and there is symmetrical appearance of contrast material in the renal collecting systems.  There is right-sided pyelocaliectasis and ureterectasis with stone fragments demonstrated in the proximal right ureter just below the ureteropelvic junction at the level of L3.  The largest stone fragment measures about 4 mm in there appear to be three stone fragments in all.  No obstructing stones are demonstrated in the left kidney or ureter.  The bladder wall is not thickened and no bladder stones are visualized.  The liver, spleen, gallbladder, pancreas, adrenal glands, inferior vena cava, retroperitoneal lymph nodes, stomach, small bowel, and colon are otherwise unremarkable.  Calcification of the aorta without  aneurysm.  No free air in the abdomen.  Pelvis:  Prostate gland is not enlarged.  There is a right inguinal hernia containing fat.  The appendix is normal.  No diverticulitis. Normal alignment of the lumbar vertebrae.  IMPRESSION: Large subcapsular hematoma in the right kidney with peri and pararenal hemorrhage as well.  No active extravasation demonstrated.  Moderately obstructing stone fragments in the proximal right ureter, largest measuring about 4 mm.  Infiltration or atelectasis in both lung bases with small left pleural effusions.   Original Report Authenticated By: Burman Nieves, M.D.     I independently reviewed the above imaging studies.  Impression/Recommendations: 1) Right subcapsular renal hematoma: Bedrest, serial Hgb and transfusions if needed.  He should remain off aspirin. Will observe over next 24 hrs assuming he remains clinically stable.  2) Right ureteral calculi: He does have remaining obstructing renal calculi. His pain symptoms seem to be more consistent with pain associated with his hematoma and I do not think intervention of his stones will be helpful to control all his pain symptoms.  Therefore, I would favor observation with medical expulsion therapy and straining of his urine.  He should continue tamsulosin for this purpose.  Assuming he remains hemodynamically stable and his pain becomes controllable, he will need close follow up with his primary urologist, Dr. Andrey Campanile, for further management of his residual ureteral stones.  Joe Gee,LES 04/10/2013, 8:47 AM    Moody Bruins MD   CC: Dr. Thedore Mins

## 2013-04-11 ENCOUNTER — Inpatient Hospital Stay (HOSPITAL_COMMUNITY): Payer: Medicare Other

## 2013-04-11 DIAGNOSIS — R109 Unspecified abdominal pain: Secondary | ICD-10-CM

## 2013-04-11 DIAGNOSIS — E785 Hyperlipidemia, unspecified: Secondary | ICD-10-CM

## 2013-04-11 DIAGNOSIS — J189 Pneumonia, unspecified organism: Secondary | ICD-10-CM

## 2013-04-11 DIAGNOSIS — I10 Essential (primary) hypertension: Secondary | ICD-10-CM

## 2013-04-11 LAB — CBC
HCT: 28.7 % — ABNORMAL LOW (ref 39.0–52.0)
Hemoglobin: 9.7 g/dL — ABNORMAL LOW (ref 13.0–17.0)
MCHC: 33.8 g/dL (ref 30.0–36.0)
WBC: 11.6 10*3/uL — ABNORMAL HIGH (ref 4.0–10.5)

## 2013-04-11 LAB — BASIC METABOLIC PANEL
BUN: 16 mg/dL (ref 6–23)
Chloride: 102 mEq/L (ref 96–112)
GFR calc Af Amer: >90 mL/min (ref >90)
Potassium: 3.4 mEq/L — ABNORMAL LOW (ref 3.5–5.1)

## 2013-04-11 LAB — STREP PNEUMONIAE URINARY ANTIGEN: Strep Pneumo Urinary Antigen: NEGATIVE

## 2013-04-11 MED ORDER — POTASSIUM CHLORIDE CRYS ER 20 MEQ PO TBCR
20.0000 meq | EXTENDED_RELEASE_TABLET | Freq: Two times a day (BID) | ORAL | Status: DC
  Administered 2013-04-11 – 2013-04-12 (×3): 20 meq via ORAL
  Filled 2013-04-11 (×4): qty 1

## 2013-04-11 MED ORDER — MENTHOL 3 MG MT LOZG
1.0000 | LOZENGE | OROMUCOSAL | Status: DC | PRN
  Filled 2013-04-11: qty 9

## 2013-04-11 MED ORDER — FUROSEMIDE 10 MG/ML IJ SOLN
20.0000 mg | Freq: Once | INTRAMUSCULAR | Status: AC
  Administered 2013-04-11: 20 mg via INTRAVENOUS
  Filled 2013-04-11: qty 2

## 2013-04-11 NOTE — Progress Notes (Signed)
Utilization Review Completed.   Jenille Laszlo, RN, BSN Nurse Case Manager  [phone]  

## 2013-04-11 NOTE — Progress Notes (Signed)
[facility] PROGRESS NOTE  Jerry Mcneil EAV:[id number] DOB: 04-01-1946 DOA: 04/10/2013 PCP: Provider Not In System  Assessment/Plan: 1. Right-sided flank pain secondary to post lithotripsy right subcapsular hematoma and few right ureteric stones,  - patient feels much better with pain control, currently in no distress, Dr. Laverle Patter has seen the patient and recommend to follow up as outpatient. No need for transfusions.  2. Possible HCAP Secondary to nausea vomiting yesterday and possible aspiration.-  Has been afebrile, asymptomatic Blood cultures and sputum cultures, oxygen and nebulizer treatments as needed, currently pain and nausea free, currently no shortness of breath is pain control is better and he is able to take deep breaths, empiric antibiotics per protocol were started, will start Levaquin in morning and d/c vancomycin    3. History of dyslipidemia. No acute issues home starting to be continued.   4. History of hypertension. On no medications blood pressure stable monitor.   5. ? Pulmonary edema; he has bibasilar crackles, will obtain 2 d echo and BNP. Will give one dose of lasix 20 mg x 1, check BNP in am  6. Hypokalemia- Replace potassium   Code Status: Full Family Communication: spoke to the patient and his wife in the room Disposition Plan: home when stable   Consultants:  Urology  Procedures:  None  Antibiotics:  Vancomycin 4/26>>  Cefepime 4/26>>  Levaquin 4/26>>  HPI/Subjective: Patient seen and examined, admitted with possible HCAP and subcapsular hematoma s/p lithotripsy. Says he feels the pain is better, he denies shortness of breath but still has cough. Patient says the breathing got worse after lithotripsy when he got IV fluids during the procedure.  Objective: Filed Vitals:   04/10/13 0940 04/10/13 1342 04/10/13 2217 04/11/13 0524  BP: 120/66 117/63 119/60 122/66  Pulse: 79 69 67 73  Temp: 97.5 F (36.4 C) 97.5 F (36.4 C) 97.2 F  (36.2 C) 97.7 F (36.5 C)  TempSrc: Oral Oral Oral Oral  Resp: 18 18 16 16   Height: 6\' 1"  (1.854 m)     Weight: 96.6 kg (212 lb 15.4 oz)   97.6 kg (215 lb 2.7 oz)  SpO2: 98% 99% 98% 98%    Intake/Output Summary (Last 24 hours) at 04/11/13 1116 Last data filed at 04/11/13 0700  Gross per 24 hour  Intake 2290.83 ml  Output   2950 ml  Net -659.17 ml   Filed Weights   04/10/13 0940 04/11/13 0524  Weight: 96.6 kg (212 lb 15.4 oz) 97.6 kg (215 lb 2.7 oz)    Exam:   General:  Appear in no acute distress  Cardiovascular: s1s2 RRR  Respiratory: Bibasilar crackles  Abdomen: Soft, positive RLQ tenderness  Musculoskeletal: No edema  Data Reviewed: Basic Metabolic Panel:  Recent Labs Lab 04/10/13 0203 04/11/13 0111  NA 135 133*  K 4.1 3.4*  CL 102 102  CO2 24 24  GLUCOSE 165* 133*  BUN 18 16  CREATININE 0.93 0.84  CALCIUM 10.3 9.5   Liver Function Tests:  Recent Labs Lab 04/10/13 0203  AST 69*  ALT 67*  ALKPHOS 72  BILITOT 0.9  PROT 6.6  ALBUMIN 3.0*    Recent Labs Lab 04/10/13 0203  LIPASE 26   No results found for this basename: AMMONIA,  in the last 168 hours CBC:  Recent Labs Lab 04/10/13 0203 04/10/13 1310 04/10/13 1900 04/11/13 0111  WBC 12.4*  --   --  11.6*  NEUTROABS 10.2*  --   --   --   HGB  10.7* 10.3* 9.8* 9.7*  HCT 31.8* 30.2* 29.6* 28.7*  MCV 89.6  --   --  90.5  PLT 317  --   --  257   Cardiac Enzymes: No results found for this basename: CKTOTAL, CKMB, CKMBINDEX, TROPONINI,  in the last 168 hours BNP (last 3 results)  Recent Labs  02/10/13 0830 04/11/13 0111  PROBNP 264.1* 178.5*   CBG: No results found for this basename: GLUCAP,  in the last 168 hours  Recent Results (from the past 240 hour(s))  CULTURE, BLOOD (ROUTINE X 2)     Status: None   Collection Time    04/10/13  2:03 AM      Result Value Range Status   Specimen Description BLOOD LEFT ARM   Final   Special Requests BOTTLES DRAWN AEROBIC AND ANAEROBIC  5CC   Final   Culture  Setup Time 04/10/2013 11:48   Final   Culture     Final   Value:        BLOOD CULTURE RECEIVED NO GROWTH TO DATE CULTURE WILL BE HELD FOR 5 DAYS BEFORE ISSUING A FINAL NEGATIVE REPORT   Report Status PENDING   Incomplete  CULTURE, BLOOD (ROUTINE X 2)     Status: None   Collection Time    04/10/13  2:15 AM      Result Value Range Status   Specimen Description BLOOD RIGHT HAND   Final   Special Requests BOTTLES DRAWN AEROBIC AND ANAEROBIC 3CC EACH   Final   Culture  Setup Time 04/10/2013 11:48   Final   Culture     Final   Value:        BLOOD CULTURE RECEIVED NO GROWTH TO DATE CULTURE WILL BE HELD FOR 5 DAYS BEFORE ISSUING A FINAL NEGATIVE REPORT   Report Status PENDING   Incomplete     Studies: Dg Chest 2 View  04/10/2013  *RADIOLOGY REPORT*  Clinical Data: Possible right lower lobe pneumonia.  Lethargic.  CHEST - 2 VIEW  Comparison: 02/10/2013.  Findings: Shallow inspiration.  There is interval development of infiltration or atelectasis in both lung bases.  Pneumonia is not excluded.  Heart size and pulmonary vascularity are prominent but likely normal for technique.  No blunting of costophrenic angles. No pneumothorax.  Mediastinal contours appear intact.  Normal alignment of thoracic vertebrae.  IMPRESSION: Shallow inspiration with infiltration or atelectasis in both lung bases.   Original Report Authenticated By: Burman Nieves, M.D.    Ct Abdomen Pelvis W Contrast  04/10/2013  *RADIOLOGY REPORT*  Clinical Data: Right flank pain after recent lithotripsy. Lithotripsy last week complicated by a hematoma.  Vomiting.  CT ABDOMEN AND PELVIS WITH CONTRAST  Technique:  Multidetector CT imaging of the abdomen and pelvis was performed following the standard protocol during bolus administration of intravenous contrast.  Contrast: OMNIPAQUE IOHEXOL 300 MG/ML  SOLN  Comparison: None.  Findings: There is atelectasis or consolidation in both lung bases with small pleural  effusions bilaterally.  There is a small esophageal hiatal hernia.  There is a large subcapsular hematoma involving the right kidney and measuring approximately 16.8 x 12 x 6.2 cm.  There is also hemorrhage infiltrating in the pararenal fat on the right as well as in the pararenal enter fascial spaces.  Fluid extends along the right pericolic gutter.  There is increased density of the hematoma consistent with acute hematoma.  There is no evidence of contrast extravasation to suggest active hemorrhage.  There is some compression  of the right renal parenchyma.  The renal nephrograms appear symmetrical and there is symmetrical appearance of contrast material in the renal collecting systems.  There is right-sided pyelocaliectasis and ureterectasis with stone fragments demonstrated in the proximal right ureter just below the ureteropelvic junction at the level of L3.  The largest stone fragment measures about 4 mm in there appear to be three stone fragments in all.  No obstructing stones are demonstrated in the left kidney or ureter.  The bladder wall is not thickened and no bladder stones are visualized.  The liver, spleen, gallbladder, pancreas, adrenal glands, inferior vena cava, retroperitoneal lymph nodes, stomach, small bowel, and colon are otherwise unremarkable.  Calcification of the aorta without aneurysm.  No free air in the abdomen.  Pelvis:  Prostate gland is not enlarged.  There is a right inguinal hernia containing fat.  The appendix is normal.  No diverticulitis. Normal alignment of the lumbar vertebrae.  IMPRESSION: Large subcapsular hematoma in the right kidney with peri and pararenal hemorrhage as well.  No active extravasation demonstrated.  Moderately obstructing stone fragments in the proximal right ureter, largest measuring about 4 mm.  Infiltration or atelectasis in both lung bases with small left pleural effusions.   Original Report Authenticated By: Burman Nieves, M.D.     Scheduled Meds: .  ceFEPime (MAXIPIME) IV  1 g Intravenous Q8H  . famotidine (PEPCID) IV  20 mg Intravenous Q12H  . fentaNYL  25 mcg Transdermal Q72H  . furosemide  20 mg Intravenous Once  . levofloxacin (LEVAQUIN) IV  750 mg Intravenous Q24H  . loratadine  10 mg Oral Daily  . oxybutynin  5 mg Oral BID  . simvastatin  10 mg Oral q1800  . sodium chloride  3 mL Intravenous Q12H  . tamsulosin  0.4 mg Oral Daily  . vancomycin  1,000 mg Intravenous Q8H   Continuous Infusions:   Principal Problem:   Right flank pain Active Problems:   Hyperlipidemia   Hypertension   Thyroid disease   Renal hematoma, right   HCAP (healthcare-associated pneumonia)    Time spent: 25 min   [facility]  [facility] Pager [phone]. If 7PM-7AM, please contact night-coverage at [email or web address], password [facility] 04/11/2013, 11:16 AM  LOS: 1 day

## 2013-04-11 NOTE — Progress Notes (Signed)
Patient ID: Jerry Mcneil, male   DOB: 22-Jun-1946, 67 y.o.   MRN: [id number]    Subjective: Pt feeling better.  Right sided pain is now much improved and he rates it as a 1 or 2 out of 10.  No nausea or vomiting. No fever.  Objective: Vital signs in last 24 hours: Temp:  [97.2 F (36.2 C)-97.7 F (36.5 C)] 97.7 F (36.5 C) (04/27 0524) Pulse Rate:  [67-83] 73 (04/27 0524) Resp:  [16-18] 16 (04/27 0524) BP: (117-128)/(60-72) 122/66 mmHg (04/27 0524) SpO2:  [95 %-99 %] 98 % (04/27 0524) Weight:  [96.6 kg (212 lb 15.4 oz)-97.6 kg (215 lb 2.7 oz)] 97.6 kg (215 lb 2.7 oz) (04/27 0524)  Intake/Output from previous day: 04/26 0701 - 04/27 0700 In: 2340.8 [P.O.:560; I.V.:930.8; IV Piggyback:750] Out: 3550 [Urine:3550] Intake/Output this shift:    Physical Exam:  General: Alert and oriented Abdomen: Soft, ND, Minimal R CVAT and RUQ tenderness  Lab Results:  Recent Labs  04/10/13 1310 04/10/13 1900 04/11/13 0111  HGB 10.3* 9.8* 9.7*  HCT 30.2* 29.6* 28.7*   BMET  Recent Labs  04/10/13 0203 04/11/13 0111  NA 135 133*  K 4.1 3.4*  CL 102 102  CO2 24 24  GLUCOSE 165* 133*  BUN 18 16  CREATININE 0.93 0.84  CALCIUM 10.3 9.5     Studies/Results:  Assessment/Plan: 1) Subcapsular right renal hematoma: His Hgb has remained stable and his pain is improved.  No need for transfusion.  He will need outpatient follow up with his primary urologist, Dr. Andrey Campanile, later this week for continued monitoring but appears stable for discharge home today.  2) Right ureteral calculi: No need for acute urologic intervention. He should continue tamsulsoin for medical expulsion therapy of his remaining fragments and should continue to strain his urine.  He will require outpatient follow up with Dr. Andrey Campanile later this week and Mr. Kneisel should bring a copy of his CT scan with him upon discharge for Dr. Andrey Campanile to review at his follow up visit.     LOS: 1 day   Jeena Arnett,LES 04/11/2013, 8:06  AM

## 2013-04-11 NOTE — Progress Notes (Cosign Needed)
[facility] progress note. Chief complaint. Dyspnea. History of present illness. This 67 year old male in hospital with right subcapsular hematoma status post lithotripsy, possible health care associated pneumonia on broad spectrum antibiotics, question pulmonary edema status post Lasix 20 mg IV earlier today. Patient complained of dyspnea to nursing and I came to see him at the bedside. Patient is sitting up in his chair with no evidence of respiratory distress. He states his dyspnea is associated with coughing fits and he feels like his" lungs are filling up again". Patient had a pro BNP earlier today resulting only 178.5. He had a chest x-ray of 04/10/13 showing sallow inspiration with infiltration or atelectasis in both lung bases. Vital signs. Temperature 98.1, pulse 97, respiration 20, blood pressure 139/62. O2 sats 97% on room air. General appearance. Well-developed elderly male who is alert, cooperative, and in no distress. Cardiac. Rate and rhythm regular. No jugular venous distention or peripheral edema. Negative Homans. Lungs. Breath sounds are somewhat reduced in the bases but clear in all fields. No crackles appreciated. Patient has no evidence of dyspnea and his O2 sats remained normal range on room air. Impression/plan. Problem #1. Complaint of dyspnea. From patient's report he believes his episode of dyspnea was associated with a coughing fit. He has Robitussin when necessary in place and I encouraged nursing to utilize this for coughing and to help him mobilize his sputum. No clinical evidence of congestive heart failure though I will repeat a B. natruretic peptide with morning labs. I will also obtain a stat portable chest x-ray to evaluate for any worsening. Patient currently demonstrates no evidence of distress and appears clinically stable per bedside exam. Also requests nursing administer a when necessary albuterol nebulizer treatment.

## 2013-04-12 DIAGNOSIS — Z5189 Encounter for other specified aftercare: Secondary | ICD-10-CM

## 2013-04-12 LAB — CBC
HCT: 31.3 % — ABNORMAL LOW (ref 39.0–52.0)
Hemoglobin: 10.3 g/dL — ABNORMAL LOW (ref 13.0–17.0)
MCH: 29.5 pg (ref 26.0–34.0)
MCV: 89.7 fL (ref 78.0–100.0)
RBC: 3.49 MIL/uL — ABNORMAL LOW (ref 4.22–5.81)

## 2013-04-12 LAB — LEGIONELLA ANTIGEN, URINE: Legionella Antigen, Urine: NEGATIVE

## 2013-04-12 LAB — BASIC METABOLIC PANEL
CO2: 25 mEq/L (ref 19–32)
Glucose, Bld: 135 mg/dL — ABNORMAL HIGH (ref 70–99)
Potassium: 3.7 mEq/L (ref 3.5–5.1)
Sodium: 134 mEq/L — ABNORMAL LOW (ref 135–145)

## 2013-04-12 MED ORDER — LEVOFLOXACIN 500 MG PO TABS: 500.0000 mg | ORAL_TABLET | Freq: Every day | ORAL | Status: DC

## 2013-04-12 MED ORDER — LEVOFLOXACIN 750 MG PO TABS: 750.0000 mg | ORAL_TABLET | Freq: Every day | ORAL | Status: DC

## 2013-04-12 NOTE — Discharge Summary (Signed)
Physician Discharge Summary  Jerry Mcneil WGN:[id number] DOB: 07/10/1946 DOA: 04/10/2013  PCP: Provider Not In System  Admit date: 04/10/2013 Discharge date: 04/12/2013  Time spent: 50* minutes  Recommendations for Outpatient Follow-up:  1. Follow up PCP in 2 weeks, for followup of 2-D echo results 2. Follow up Urology in one week  Discharge Diagnoses:  Principal Problem:   Right flank pain Active Problems:   Hyperlipidemia   Hypertension   Thyroid disease   Renal hematoma, right   HCAP (healthcare-associated pneumonia)   Discharge Condition: Stable  Diet recommendation:  Low salt diet  Filed Weights   04/10/13 0940 04/11/13 0524 04/12/13 0524  Weight: 96.6 kg (212 lb 15.4 oz) 97.6 kg (215 lb 2.7 oz) 95.709 kg (211 lb)    History of present illness:  67 y.o. male, with H/O HTN, Dyslipidemia, Kidney stones, who underwent lithotripsy at [facility] in [place] by Dr. Leanord Asal phone number [phone] about 7 days ago, his procedure was complicated by the formation of right renal hematoma, he was subsequently admitted to the hospital, she was reoperated a few days later by removal of a few ureteric stones and a basement of right ureteric stent, couple of days ago his right ureteric stent was removed and patient went home, however patient started experiencing excruciating right-sided flank pain and was unable to take deep breaths, he also threw up a few times and developed some shortness of breath, he visited his urologist where a chest x-ray showed possible right lower lobe infiltrate he was then asked to come to the ER.  In the ER workup suggestive of leukocytosis, possible HCAP, and a new CT scan showed right sided large subcapsular renal hematoma with some ureteric stones on the right side, case was discussed by the ER physician and me with urologist Dr. Laverle Patter who recommended that patient be admitted by hospitalist and he would consult and follow the patient closely.       Hospital Course:   Right-sided flank pain secondary to post lithotripsy right subcapsular hematoma and few right ureteric stones, - patient feels much better with pain control, currently in no distress, Dr. Laverle Patter has seen the patient and recommend to follow up as outpatient. No need for transfusions as hemoglobin was stable in the hospital.  2. Possible HCAP Secondary to nausea vomiting yesterday and possible aspiration.-  Has been afebrile, asymptomatic  Blood cultures and sputum cultures, oxygen and nebulizer treatments as needed, currently pain and nausea free, currently no shortness of breath is pain control is better and he is able to take deep breaths, empiric antibiotics per protocol were started,repeat CXR shows no infiltrate , will d/c him on Levaquin 750 mg po daily x 7 days.  3. History of dyslipidemia. No acute issues home statin will  be continued.   4. History of hypertension. On no medications blood pressure stable   5. ? Pulmonary edema; patient had  bibasilar crackles, will obtain 2 d echo and BNP. He received one dose of Lasix and diuresed more than 3 L. BNP was only mildly elevated to 178. 2-D echo was done results are pending at this time. Patient family care physician she can follow up 2-D echo results     Procedures:  2-D echo  Consultations:   none   Discharge Exam: Filed Vitals:   04/11/13 1315 04/11/13 2012 04/11/13 2030 04/12/13 0524  BP: 114/56  139/62 131/66  Pulse: 79  97 88  Temp: 98.1 F (36.7 C)  98.4 F (  36.9 C) 98.3 F (36.8 C)  TempSrc: Oral  Oral Oral  Resp: 18  20 20   Height:      Weight:    95.709 kg (211 lb)  SpO2: 97% 97% 97% 98%    General: Appear in no acute distress  Cardiovascular: S1-S2 regular  Respiratory: clear bilaterally   extremities: No edema  Discharge Instructions  Discharge Orders   Future Orders Complete By Expires     Diet - low sodium heart healthy  As directed     Increase activity slowly  As  directed         Medication List    STOP taking these medications       azithromycin 250 MG tablet  Commonly known as:  ZITHROMAX     simvastatin 10 MG tablet  Commonly known as:  ZOCOR      TAKE these medications       aspirin 81 MG tablet  Take 81 mg by mouth daily.     guaiFENesin 100 MG/5ML Soln  Commonly known as:  ROBITUSSIN  Take 10 mLs by mouth every 4 (four) hours as needed. For cough     levofloxacin 750 MG tablet  Commonly known as:  LEVAQUIN  Take 1 tablet (750 mg total) by mouth daily.     loratadine 10 MG tablet  Commonly known as:  CLARITIN  Take 10 mg by mouth daily.     lovastatin 10 MG tablet  Commonly known as:  MEVACOR  Take 10 mg by mouth at bedtime.     oxybutynin 5 MG tablet  Commonly known as:  DITROPAN  Take 5 mg by mouth 2 (two) times daily as needed. For bladder spasms     oxyCODONE-acetaminophen 5-325 MG per tablet  Commonly known as:  PERCOCET/ROXICET  Take 1 tablet by mouth every 4 (four) hours as needed for pain.     ranitidine 150 MG tablet  Commonly known as:  ZANTAC  Take 150 mg by mouth daily as needed for heartburn.     tamsulosin 0.4 MG Caps  Commonly known as:  FLOMAX  Take 0.4 mg by mouth daily.     traMADol 50 MG tablet  Commonly known as:  ULTRAM  Take 50 mg by mouth every 6 (six) hours as needed for pain.          The results of significant diagnostics from this hospitalization (including imaging, microbiology, ancillary and laboratory) are listed below for reference.    Significant Diagnostic Studies: Dg Chest 2 View  04/10/2013  *RADIOLOGY REPORT*  Clinical Data: Possible right lower lobe pneumonia.  Lethargic.  CHEST - 2 VIEW  Comparison: 02/10/2013.  Findings: Shallow inspiration.  There is interval development of infiltration or atelectasis in both lung bases.  Pneumonia is not excluded.  Heart size and pulmonary vascularity are prominent but likely normal for technique.  No blunting of costophrenic  angles. No pneumothorax.  Mediastinal contours appear intact.  Normal alignment of thoracic vertebrae.  IMPRESSION: Shallow inspiration with infiltration or atelectasis in both lung bases.   Original Report Authenticated By: Burman Nieves, M.D.    Ct Abdomen Pelvis W Contrast  04/10/2013  *RADIOLOGY REPORT*  Clinical Data: Right flank pain after recent lithotripsy. Lithotripsy last week complicated by a hematoma.  Vomiting.  CT ABDOMEN AND PELVIS WITH CONTRAST  Technique:  Multidetector CT imaging of the abdomen and pelvis was performed following the standard protocol during bolus administration of intravenous contrast.  Contrast: OMNIPAQUE IOHEXOL  300 MG/ML  SOLN  Comparison: None.  Findings: There is atelectasis or consolidation in both lung bases with small pleural effusions bilaterally.  There is a small esophageal hiatal hernia.  There is a large subcapsular hematoma involving the right kidney and measuring approximately 16.8 x 12 x 6.2 cm.  There is also hemorrhage infiltrating in the pararenal fat on the right as well as in the pararenal enter fascial spaces.  Fluid extends along the right pericolic gutter.  There is increased density of the hematoma consistent with acute hematoma.  There is no evidence of contrast extravasation to suggest active hemorrhage.  There is some compression of the right renal parenchyma.  The renal nephrograms appear symmetrical and there is symmetrical appearance of contrast material in the renal collecting systems.  There is right-sided pyelocaliectasis and ureterectasis with stone fragments demonstrated in the proximal right ureter just below the ureteropelvic junction at the level of L3.  The largest stone fragment measures about 4 mm in there appear to be three stone fragments in all.  No obstructing stones are demonstrated in the left kidney or ureter.  The bladder wall is not thickened and no bladder stones are visualized.  The liver, spleen, gallbladder,  pancreas, adrenal glands, inferior vena cava, retroperitoneal lymph nodes, stomach, small bowel, and colon are otherwise unremarkable.  Calcification of the aorta without aneurysm.  No free air in the abdomen.  Pelvis:  Prostate gland is not enlarged.  There is a right inguinal hernia containing fat.  The appendix is normal.  No diverticulitis. Normal alignment of the lumbar vertebrae.  IMPRESSION: Large subcapsular hematoma in the right kidney with peri and pararenal hemorrhage as well.  No active extravasation demonstrated.  Moderately obstructing stone fragments in the proximal right ureter, largest measuring about 4 mm.  Infiltration or atelectasis in both lung bases with small left pleural effusions.   Original Report Authenticated By: Burman Nieves, M.D.    Dg Chest Port 1 View  04/11/2013  *RADIOLOGY REPORT*  Clinical Data: Dyspnea.  Cough.  PORTABLE CHEST - 1 VIEW  Comparison: 04/10/2013  Findings: Low lung volumes noted with mild interstitial accentuation.  Aeration of the lung bases is improved compared to yesterday's exam.  Heart size within normal limits.  The patient is hiatal hernia is not readily apparent.  IMPRESSION:  1.  Low lung volumes. 2.  Improved aeration at both lung bases. 3.  Low-level interstitial accentuation, probably related to the low lung volumes.   Original Report Authenticated By: Gaylyn Rong, M.D.     Microbiology: Recent Results (from the past 240 hour(s))  CULTURE, BLOOD (ROUTINE X 2)     Status: None   Collection Time    04/10/13  2:03 AM      Result Value Range Status   Specimen Description BLOOD LEFT ARM   Final   Special Requests BOTTLES DRAWN AEROBIC AND ANAEROBIC 5CC   Final   Culture  Setup Time 04/10/2013 11:48   Final   Culture     Final   Value:        BLOOD CULTURE RECEIVED NO GROWTH TO DATE CULTURE WILL BE HELD FOR 5 DAYS BEFORE ISSUING A FINAL NEGATIVE REPORT   Report Status PENDING   Incomplete  CULTURE, BLOOD (ROUTINE X 2)     Status: None    Collection Time    04/10/13  2:15 AM      Result Value Range Status   Specimen Description BLOOD RIGHT HAND   Final  Special Requests BOTTLES DRAWN AEROBIC AND ANAEROBIC 3CC EACH   Final   Culture  Setup Time 04/10/2013 11:48   Final   Culture     Final   Value:        BLOOD CULTURE RECEIVED NO GROWTH TO DATE CULTURE WILL BE HELD FOR 5 DAYS BEFORE ISSUING A FINAL NEGATIVE REPORT   Report Status PENDING   Incomplete     Labs: Basic Metabolic Panel:  Recent Labs Lab 04/10/13 0203 04/11/13 0111 04/12/13 0445  NA 135 133* 134*  K 4.1 3.4* 3.7  CL 102 102 102  CO2 24 24 25   GLUCOSE 165* 133* 135*  BUN 18 16 16   CREATININE 0.93 0.84 0.88  CALCIUM 10.3 9.5 10.0   Liver Function Tests:  Recent Labs Lab 04/10/13 0203  AST 69*  ALT 67*  ALKPHOS 72  BILITOT 0.9  PROT 6.6  ALBUMIN 3.0*    Recent Labs Lab 04/10/13 0203  LIPASE 26   No results found for this basename: AMMONIA,  in the last 168 hours CBC:  Recent Labs Lab 04/10/13 0203 04/10/13 1310 04/10/13 1900 04/11/13 0111 04/12/13 0445  WBC 12.4*  --   --  11.6* 10.5  NEUTROABS 10.2*  --   --   --   --   HGB 10.7* 10.3* 9.8* 9.7* 10.3*  HCT 31.8* 30.2* 29.6* 28.7* 31.3*  MCV 89.6  --   --  90.5 89.7  PLT 317  --   --  257 331   Cardiac Enzymes: No results found for this basename: CKTOTAL, CKMB, CKMBINDEX, TROPONINI,  in the last 168 hours BNP: BNP (last 3 results)  Recent Labs  02/10/13 0830 04/11/13 0111 04/12/13 0445  PROBNP 264.1* 178.5* 81.0   CBG: No results found for this basename: GLUCAP,  in the last 168 hours     Signed:  LAMA,GAGAN S  [facility] 04/12/2013, 11:26 AM

## 2013-04-12 NOTE — Progress Notes (Signed)
Echocardiogram 2D Echocardiogram has been performed.  Jerry Mcneil 04/12/2013, 3:21 PM

## 2013-04-12 NOTE — Progress Notes (Signed)
ANTIBIOTIC CONSULT NOTE - FOLLOW UP  Pharmacy Consult for Vancomycin/Renal adjust Antibiotics Indication: rule out pneumonia  Allergies  Allergen Reactions  . Iohexol Swelling    Patient Measurements: Height: 6\' 1"  (185.4 cm) Weight: 211 lb (95.709 kg) IBW/kg (Calculated) : 79.9  Vital Signs: Temp: 98.3 F (36.8 C) (04/28 0524) Temp src: Oral (04/28 0524) BP: 131/66 mmHg (04/28 0524) Pulse Rate: 88 (04/28 0524) Intake/Output from previous day: 04/27 0701 - 04/28 0700 In: 1155.8 [I.V.:155.8; IV Piggyback:1000] Out: 3975 [Urine:3975] Intake/Output from this shift: Total I/O In: 240 [P.O.:240] Out: -   Labs:  Recent Labs  04/10/13 0203  04/10/13 1900 04/11/13 0111 04/12/13 0445  WBC 12.4*  --   --  11.6* 10.5  HGB 10.7*  < > 9.8* 9.7* 10.3*  PLT 317  --   --  257 331  CREATININE 0.93  --   --  0.84 0.88  < > = values in this interval not displayed. Estimated Creatinine Clearance: 94.6 ml/min (by C-G formula based on Cr of 0.88).  84 ml/min/1.67m2 (normalized)   Microbiology: Recent Results (from the past 720 hour(s))  CULTURE, BLOOD (ROUTINE X 2)     Status: None   Collection Time    04/10/13  2:03 AM      Result Value Range Status   Specimen Description BLOOD LEFT ARM   Final   Special Requests BOTTLES DRAWN AEROBIC AND ANAEROBIC 5CC   Final   Culture  Setup Time 04/10/2013 11:48   Final   Culture     Final   Value:        BLOOD CULTURE RECEIVED NO GROWTH TO DATE CULTURE WILL BE HELD FOR 5 DAYS BEFORE ISSUING A FINAL NEGATIVE REPORT   Report Status PENDING   Incomplete  CULTURE, BLOOD (ROUTINE X 2)     Status: None   Collection Time    04/10/13  2:15 AM      Result Value Range Status   Specimen Description BLOOD RIGHT HAND   Final   Special Requests BOTTLES DRAWN AEROBIC AND ANAEROBIC 3CC EACH   Final   Culture  Setup Time 04/10/2013 11:48   Final   Culture     Final   Value:        BLOOD CULTURE RECEIVED NO GROWTH TO DATE CULTURE WILL BE HELD FOR 5  DAYS BEFORE ISSUING A FINAL NEGATIVE REPORT   Report Status PENDING   Incomplete    Anti-infectives   Start     Dose/Rate Route Frequency Ordered Stop   04/11/13 0200  levofloxacin (LEVAQUIN) IVPB 750 mg     750 mg 100 mL/hr over 90 Minutes Intravenous Every 24 hours 04/10/13 0943 04/12/13 0506   04/10/13 1600  vancomycin (VANCOCIN) IVPB 1000 mg/200 mL premix     1,000 mg 200 mL/hr over 60 Minutes Intravenous Every 8 hours 04/10/13 1017 04/18/13 1559   04/10/13 0815  vancomycin (VANCOCIN) IVPB 1000 mg/200 mL premix     1,000 mg 200 mL/hr over 60 Minutes Intravenous STAT 04/10/13 0805 04/10/13 0927   04/10/13 0800  ceFEPIme (MAXIPIME) 1 g in dextrose 5 % 50 mL IVPB     1 g 100 mL/hr over 30 Minutes Intravenous Every 8 hours 04/10/13 0748 04/18/13 0159   04/10/13 0800  levofloxacin (LEVAQUIN) IVPB 750 mg  Status:  Discontinued     750 mg 100 mL/hr over 90 Minutes Intravenous Every 24 hours 04/10/13 0748 04/10/13 0943   04/10/13 0200  cefTRIAXone (ROCEPHIN) 2  g in dextrose 5 % 50 mL IVPB     2 g 100 mL/hr over 30 Minutes Intravenous  Once 04/10/13 0153 04/10/13 0249   04/10/13 0200  levofloxacin (LEVAQUIN) IVPB 750 mg     750 mg 100 mL/hr over 90 Minutes Intravenous  Once 04/10/13 0153 04/10/13 0401      Assessment: 66 YOM admitted via ER 4/26. CXR outpt shows RLL pna. Was given zpack. Came to Er w/ R shoulder and R flank pain. Had lithotripsy ~ 1 week ago.  Day #3 of 8 vancomycin 1g q8h and cefepime 1g q8h and #3 of 3 Levaquin 750mg  q24h for presumed HCAP vs aspiration  CXR overnight shows improved aeration from admit  Afeb  SCr remains stable  WBC improved to WNL  Blood cultures negative to date  Goal of Therapy:  Vancomycin trough level 15-20 mcg/ml Cefepime/Levaquin doses per renal function  Plan:   Check vancomycin trough tonight if not discharged  Continue cefepime 1g IV q8h  Levaquin ends today  Loralee Pacas, PharmD, BCPS Pager:  [phone] 04/12/2013,10:36 AM

## 2013-04-16 LAB — CULTURE, BLOOD (ROUTINE X 2): Culture: NO GROWTH

## 2013-10-24 ENCOUNTER — Emergency Department (HOSPITAL_COMMUNITY)
Admission: EM | Admit: 2013-10-24 | Discharge: 2013-10-24 | Disposition: A | Discharge status: Home / Self Care | Payer: Medicare Other | Attending: Emergency Medicine | Admitting: Emergency Medicine

## 2013-10-24 ENCOUNTER — Encounter (HOSPITAL_COMMUNITY): Payer: Self-pay | Admitting: Emergency Medicine

## 2013-10-24 DIAGNOSIS — M25559 Pain in unspecified hip: Secondary | ICD-10-CM | POA: Insufficient documentation

## 2013-10-24 DIAGNOSIS — Z792 Long term (current) use of antibiotics: Secondary | ICD-10-CM | POA: Insufficient documentation

## 2013-10-24 DIAGNOSIS — Z79899 Other long term (current) drug therapy: Secondary | ICD-10-CM | POA: Insufficient documentation

## 2013-10-24 DIAGNOSIS — M79604 Pain in right leg: Secondary | ICD-10-CM

## 2013-10-24 DIAGNOSIS — Z7982 Long term (current) use of aspirin: Secondary | ICD-10-CM | POA: Insufficient documentation

## 2013-10-24 DIAGNOSIS — I10 Essential (primary) hypertension: Secondary | ICD-10-CM | POA: Insufficient documentation

## 2013-10-24 DIAGNOSIS — E785 Hyperlipidemia, unspecified: Secondary | ICD-10-CM | POA: Insufficient documentation

## 2013-10-24 DIAGNOSIS — M79609 Pain in unspecified limb: Secondary | ICD-10-CM

## 2013-10-24 DIAGNOSIS — Z9889 Other specified postprocedural states: Secondary | ICD-10-CM | POA: Insufficient documentation

## 2013-10-24 DIAGNOSIS — Z8669 Personal history of other diseases of the nervous system and sense organs: Secondary | ICD-10-CM | POA: Insufficient documentation

## 2013-10-24 MED ORDER — METHOCARBAMOL 500 MG PO TABS: 1000.0000 mg | ORAL_TABLET | Freq: Four times a day (QID) | ORAL | Status: DC

## 2013-10-24 MED ORDER — OXYCODONE HCL 5 MG PO TABS: 5.0000 mg | ORAL_TABLET | ORAL | Status: DC | PRN

## 2013-10-24 NOTE — ED Notes (Signed)
Pt from home reports that he is having R leg pain that starts in R hip and radiates to knee. Pt had parathyroid removal x6 days ago at [facility]. Pt started to have R leg pain started the day after sx. Pt went to his PCP Friday and was told to treat with ibuprofen and heat which pt had no relief. Pt has bronchitis which he is being tx for with a Z-pack. Pt is A&O and in NAD

## 2013-10-24 NOTE — ED Provider Notes (Signed)
CSN: [id number]     Arrival date & time 10/24/13  4098 History   First MD Initiated Contact with Patient 10/24/13 1002     Chief Complaint  Patient presents with  . Leg Pain   (Consider location/radiation/quality/duration/timing/severity/associated sxs/prior Treatment) HPI Comments: Patient with h/o parathyroid surgery 6 days ago performed at [facility]. The day after surgery patient developed pain in his right hip that is worse with movement and much worse with standing. Pain is a shooting pain that radiates to his right knee. No calf tenderness or swelling. No swelling of his upper leg. Patient has been taking oxycodone 5 mg tablets postop. Patient has also been taking ibuprofen. Patient was seen by his primary care physician regarding hip pain 2 days ago who made no changes to treatment regimen. Patient called a postop hotline today and was told to come to the emergency department for evaluation of DVT. The onset of this condition was acute. The course is constant. Aggravating factors: standing, walking, movement. Alleviating factors: pain medications. No h/o low back problems.    Patient is a 67 y.o. male presenting with leg pain. The history is provided by the patient.  Leg Pain Associated symptoms: no back pain and no fever     Past Medical History  Diagnosis Date  . Hyperlipidemia   . Hypertension   . Seasonal allergies   . Bursitis of left shoulder   . Bursitis of right shoulder   . Hearing impaired   . Thyroid disease    Past Surgical History  Procedure Laterality Date  . Hotel manager  . Fluid aspirated from heart    . Eye surgery    . Testicular cancer    . Parathyroidectomy      Partial  . Thyroidectomy, partial     Family History  Problem Relation Age of Onset  . Heart murmur Mother   . Cancer Father    History  Substance Use Topics  . Smoking status: Never Smoker   . Smokeless tobacco: Never Used  . Alcohol Use: No    Review of  Systems  Constitutional: Negative for fever and unexpected weight change.  HENT: Negative for rhinorrhea and sore throat.   Eyes: Negative for redness.  Respiratory: Negative for cough.   Cardiovascular: Negative for chest pain.  Gastrointestinal: Negative for nausea, vomiting, abdominal pain, diarrhea and constipation.       Neg for fecal incontinence  Genitourinary: Negative for dysuria, hematuria, flank pain and difficulty urinating.       Negative for urinary incontinence or retention  Musculoskeletal: Positive for arthralgias and myalgias. Negative for back pain.  Skin: Negative for rash.  Neurological: Negative for weakness, numbness and headaches.       Negative for saddle paresthesias     Allergies  Iohexol  Home Medications   Current Outpatient Rx  Name  Route  Sig  Dispense  Refill  . aspirin 81 MG tablet   Oral   Take 81 mg by mouth daily.         Marland Kitchen guaiFENesin (ROBITUSSIN) 100 MG/5ML SOLN   Oral   Take 10 mLs by mouth every 4 (four) hours as needed. For cough         . levofloxacin (LEVAQUIN) 750 MG tablet   Oral   Take 1 tablet (750 mg total) by mouth daily.   7 tablet   0   . loratadine (CLARITIN) 10 MG tablet   Oral  Take 10 mg by mouth daily.         Marland Kitchen lovastatin (MEVACOR) 10 MG tablet   Oral   Take 10 mg by mouth at bedtime.         Marland Kitchen oxybutynin (DITROPAN) 5 MG tablet   Oral   Take 5 mg by mouth 2 (two) times daily as needed. For bladder spasms         . oxyCODONE-acetaminophen (PERCOCET/ROXICET) 5-325 MG per tablet   Oral   Take 1 tablet by mouth every 4 (four) hours as needed for pain.         . ranitidine (ZANTAC) 150 MG tablet   Oral   Take 150 mg by mouth daily as needed for heartburn.         . tamsulosin (FLOMAX) 0.4 MG CAPS   Oral   Take 0.4 mg by mouth daily.         . traMADol (ULTRAM) 50 MG tablet   Oral   Take 50 mg by mouth every 6 (six) hours as needed for pain.          BP 125/79  Pulse 76   Temp(Src) 98.3 F (36.8 C) (Oral)  Resp 16  SpO2 94% Physical Exam  Nursing note and vitals reviewed. Constitutional: He appears well-developed and well-nourished.  HENT:  Head: Normocephalic and atraumatic.  Previous surgical site covered.   Eyes: Conjunctivae are normal.  Neck: Normal range of motion.  Abdominal: Soft. There is no tenderness. There is no CVA tenderness.  Musculoskeletal: Normal range of motion.       Right hip: He exhibits tenderness. He exhibits normal range of motion and normal strength.       Left hip: Normal.       Right knee: Normal.       Right ankle: Normal.       Lumbar back: Normal. He exhibits normal range of motion, no tenderness and no bony tenderness.       Legs: No step-off noted with palpation of spine.   Neurological: He is alert. He has normal reflexes. No sensory deficit. He exhibits normal muscle tone.  5/5 strength in entire lower extremities bilaterally. No sensation deficit.   Skin: Skin is warm and dry.  Psychiatric: He has a normal mood and affect.    ED Course  Procedures (including critical care time) Labs Review Labs Reviewed - No data to display Imaging Review No results found.  EKG Interpretation   None      10:47 AM Patient seen and examined. Doppler ordered. D/w Dr. Freida Busman. Suspect MSK/radicular pain.   Vital signs reviewed and are as follows: Filed Vitals:   10/24/13 0957  BP: 125/79  Pulse: 76  Temp: 98.3 F (36.8 C)  Resp: 16   Doppler neg. Dr. Freida Busman has seen patient. Agreed with d/c. Will d/c to home with pain medication and muscle relaxer.   No red flag s/s of low back pain. Patient was counseled on back pain precautions and told to do activity as tolerated but do not lift, push, or pull heavy objects more than 10 pounds for the next week.  Patient counseled to use ice or heat on back for no longer than 15 minutes every hour.   Patient prescribed muscle relaxer and counseled on proper use of muscle relaxant  medication.    Patient prescribed narcotic pain medicine and counseled on proper use of narcotic pain medications. Counseled not to combine this medication with others containing tylenol.  Urged patient not to drink alcohol, drive, or perform any other activities that requires focus while taking either of these medications.  Patient urged to follow-up with PCP if pain does not improve with treatment and rest or if pain becomes recurrent. Urged to return with worsening severe pain, loss of bowel or bladder control, trouble walking.   The patient verbalizes understanding and agrees with the plan.   MDM   1. Leg pain, right    Back and leg pain. Sent in for DVT eval, although exam is not very suspicious for DVT -- more consistent with radicular pain. [place] neg for DVT. No neurological deficits. Patient is ambulatory. No warning symptoms of back pain including: loss of bowel or bladder control, night sweats, waking from sleep with back pain, unexplained fevers or weight loss, h/o cancer, IVDU, recent trauma. No concern for cauda equina, epidural abscess, or other serious cause of back pain. Conservative measures such as rest, ice/heat and pain medicine indicated with PCP follow-up if no improvement with conservative management.     Renne Crigler, PA-C 10/24/13 1446

## 2013-10-24 NOTE — ED Provider Notes (Signed)
Medical screening examination/treatment/procedure(s) were conducted as a shared visit with non-physician practitioner(s) and myself.  I personally evaluated the patient during the encounter.  EKG Interpretation   None        Toy Baker, MD 10/24/13 1447

## 2013-10-24 NOTE — ED Provider Notes (Signed)
Medical screening examination/treatment/procedure(s) were conducted as a shared visit with non-physician practitioner(s) and myself.  I personally evaluated the patient during the encounter.  EKG Interpretation   None      Patient seen examined and has pain in his right sciatic notch. Ultrasounds that was negative. Not Concern for cauda equina. Patient to be discharged meds for pain and muscle excision  Toy Baker, MD 10/24/13 1212

## 2013-10-24 NOTE — Progress Notes (Signed)
Right lower extremity venous duplex completed.  Right:  No evidence of DVT, superficial thrombosis, or Baker's cyst.  Left:  Negative for DVT in the common femoral vein.  

## 2015-10-14 ENCOUNTER — Encounter (HOSPITAL_COMMUNITY): Payer: Self-pay | Admitting: Emergency Medicine

## 2015-10-14 ENCOUNTER — Emergency Department (HOSPITAL_COMMUNITY)
Admission: EM | Admit: 2015-10-14 | Discharge: 2015-10-15 | Disposition: A | Discharge status: Home / Self Care | Payer: Medicare Other | Attending: Emergency Medicine | Admitting: Emergency Medicine

## 2015-10-14 DIAGNOSIS — I10 Essential (primary) hypertension: Secondary | ICD-10-CM | POA: Insufficient documentation

## 2015-10-14 DIAGNOSIS — E785 Hyperlipidemia, unspecified: Secondary | ICD-10-CM | POA: Diagnosis not present

## 2015-10-14 DIAGNOSIS — Z9889 Other specified postprocedural states: Secondary | ICD-10-CM | POA: Insufficient documentation

## 2015-10-14 DIAGNOSIS — Z8739 Personal history of other diseases of the musculoskeletal system and connective tissue: Secondary | ICD-10-CM | POA: Diagnosis not present

## 2015-10-14 DIAGNOSIS — R319 Hematuria, unspecified: Secondary | ICD-10-CM | POA: Diagnosis present

## 2015-10-14 DIAGNOSIS — Z79899 Other long term (current) drug therapy: Secondary | ICD-10-CM | POA: Diagnosis not present

## 2015-10-14 DIAGNOSIS — Z87721 Personal history of (corrected) congenital malformations of ear: Secondary | ICD-10-CM | POA: Insufficient documentation

## 2015-10-14 DIAGNOSIS — Z7982 Long term (current) use of aspirin: Secondary | ICD-10-CM | POA: Insufficient documentation

## 2015-10-14 DIAGNOSIS — N200 Calculus of kidney: Secondary | ICD-10-CM

## 2015-10-14 DIAGNOSIS — Z7951 Long term (current) use of inhaled steroids: Secondary | ICD-10-CM | POA: Diagnosis not present

## 2015-10-14 DIAGNOSIS — N1330 Unspecified hydronephrosis: Secondary | ICD-10-CM | POA: Diagnosis not present

## 2015-10-14 NOTE — ED Notes (Signed)
Pt presents with c/o hematuria x 2 hours. Pain to L flank, HA, malaise.

## 2015-10-15 ENCOUNTER — Emergency Department (HOSPITAL_COMMUNITY): Payer: Medicare Other

## 2015-10-15 DIAGNOSIS — N1330 Unspecified hydronephrosis: Secondary | ICD-10-CM | POA: Diagnosis not present

## 2015-10-15 LAB — URINE MICROSCOPIC-ADD ON

## 2015-10-15 LAB — URINALYSIS, ROUTINE W REFLEX MICROSCOPIC
BILIRUBIN URINE: NEGATIVE
Glucose, UA: NEGATIVE mg/dL
KETONES UR: NEGATIVE mg/dL
NITRITE: NEGATIVE
PROTEIN: 30 mg/dL — AB
Specific Gravity, Urine: 1.018 (ref 1.005–1.030)
UROBILINOGEN UA: 1 mg/dL (ref 0.0–1.0)
pH: 5.5 (ref 5.0–8.0)

## 2015-10-15 LAB — CBC
HEMATOCRIT: 43.7 % (ref 39.0–52.0)
HEMOGLOBIN: 14.7 g/dL (ref 13.0–17.0)
MCH: 30.9 pg (ref 26.0–34.0)
MCHC: 33.6 g/dL (ref 30.0–36.0)
MCV: 92 fL (ref 78.0–100.0)
Platelets: 156 10*3/uL (ref 150–400)
RBC: 4.75 MIL/uL (ref 4.22–5.81)
RDW: 14.1 % (ref 11.5–15.5)
WBC: 9 10*3/uL (ref 4.0–10.5)

## 2015-10-15 LAB — BASIC METABOLIC PANEL
ANION GAP: 9 (ref 5–15)
BUN: 26 mg/dL — ABNORMAL HIGH (ref 6–20)
CHLORIDE: 108 mmol/L (ref 101–111)
CO2: 21 mmol/L — AB (ref 22–32)
Calcium: 9.6 mg/dL (ref 8.9–10.3)
Creatinine, Ser: 1.21 mg/dL (ref 0.61–1.24)
GFR calc non Af Amer: 60 mL/min — ABNORMAL LOW (ref >60)
GLUCOSE: 174 mg/dL — AB (ref 65–99)
Potassium: 3.6 mmol/L (ref 3.5–5.1)
Sodium: 138 mmol/L (ref 135–145)

## 2015-10-15 MED ORDER — CEPHALEXIN 500 MG PO CAPS: 500.0000 mg | ORAL_CAPSULE | Freq: Three times a day (TID) | ORAL | Status: DC

## 2015-10-15 MED ORDER — OXYCODONE HCL 5 MG PO TABS: 5.0000 mg | ORAL_TABLET | ORAL | Status: DC | PRN

## 2015-10-15 NOTE — Discharge Instructions (Signed)
Hydronephrosis °Hydronephrosis is the enlargement of a kidney due to a blockage that stops urine from flowing out of the body. °CAUSES °Common causes of this condition include: °· A birth (congenital) defect of the kidney. °· A congenital defect of the tube through which urine travels (ureter). °· Kidney stones. °· An enlarged prostate gland. °· A tumor. °· Cancer of the prostate, bladder, uterus, ovary, or colon. °· A blood clot. °SYMPTOMS °Symptoms of this condition include: °· Pain or discomfort in your side (flank). °· Swelling of the abdomen. °· Pain in the abdomen. °· Nausea and vomiting. °· Fever. °· Pain while passing urine. °· Feeling of urgency to urinate. °· Frequent urination. °· Infection of the urinary tract. °In some cases, there are no symptoms. °DIAGNOSIS °This condition may be diagnosed with: °· A medical history. °· A physical exam. °· Blood and urine tests to check kidney function. °· Imaging tests, such as an X-ray, ultrasound, CT scan, or MRI. °· A test in which a rigid or flexible telescope (cystoscope) is used to view the site of the blockage. °TREATMENT °Treatment for this condition depends on where the blockage is located, how long it has been there, and what caused it. The goal of treatment is to remove the blockage. Treatment options include: °· A procedure to put in a soft tube to help drain urine. °· Antibiotic medicines to treat or prevent infection. °· Shock-wave therapy (lithotripsy) to help eliminate kidney stones. °HOME CARE INSTRUCTIONS °· Get lots of rest. °· Drink enough fluid to keep your urine clear or pale yellow. °· If you have a drain in, follow your health care provider's instructions about how to care for it. °· Take medicines only as directed by your health care provider. °· If you were prescribed an antibiotic medicine, finish all of it even if you start to feel better. °· Keep all follow-up visits as directed by your health care provider. This is important. °SEEK  MEDICAL CARE IF: °· You continue to have symptoms after treatment. °· You develop new symptoms. °· You have a problem with a drainage device. °· Your urine becomes cloudy or bloody. °· You have a fever. °SEEK IMMEDIATE MEDICAL CARE IF: °· You have severe flank or abdominal pain. °· You develop vomiting and are unable to keep fluids down. °  °This information is not intended to replace advice given to you by your health care provider. Make sure you discuss any questions you have with your health care provider. °  °Document Released: 09/29/2007 Document Revised: 04/18/2015 Document Reviewed: 11/28/2014 °Elsevier Interactive Patient Education ©2016 [employer] °Kidney Stones °Kidney stones (urolithiasis) are deposits that form inside your kidneys. The intense pain is caused by the stone moving through the urinary tract. When the stone moves, the ureter goes into spasm around the stone. The stone is usually passed in the urine.  °CAUSES  °· A disorder that makes certain neck glands produce too much parathyroid hormone (primary hyperparathyroidism). °· A buildup of uric acid crystals, similar to gout in your joints. °· Narrowing (stricture) of the ureter. °· A kidney obstruction present at birth (congenital obstruction). °· Previous surgery on the kidney or ureters. °· Numerous kidney infections. °SYMPTOMS  °· Feeling sick to your stomach (nauseous). °· Throwing up (vomiting). °· Blood in the urine (hematuria). °· Pain that usually spreads (radiates) to the groin. °· Frequency or urgency of urination. °DIAGNOSIS  °· Taking a history and physical exam. °· Blood or urine tests. °· CT scan. °·   Occasionally, an examination of the inside of the urinary bladder (cystoscopy) is performed. °TREATMENT  °· Observation. °· Increasing your fluid intake. °· Extracorporeal shock wave lithotripsy--This is a noninvasive procedure that uses shock waves to break up kidney stones. °· Surgery may be needed if you have severe pain or  persistent obstruction. There are various surgical procedures. Most of the procedures are performed with the use of small instruments. Only small incisions are needed to accommodate these instruments, so recovery time is minimized. °The size, location, and chemical composition are all important variables that will determine the proper choice of action for you. Talk to your health care provider to better understand your situation so that you will minimize the risk of injury to yourself and your kidney.  °HOME CARE INSTRUCTIONS  °· Drink enough water and fluids to keep your urine clear or pale yellow. This will help you to pass the stone or stone fragments. °· Strain all urine through the provided strainer. Keep all particulate matter and stones for your health care provider to see. The stone causing the pain may be as small as a grain of salt. It is very important to use the strainer each and every time you pass your urine. The collection of your stone will allow your health care provider to analyze it and verify that a stone has actually passed. The stone analysis will often identify what you can do to reduce the incidence of recurrences. °· Only take over-the-counter or prescription medicines for pain, discomfort, or fever as directed by your health care provider. °· Keep all follow-up visits as told by your health care provider. This is important. °· Get follow-up X-rays if required. The absence of pain does not always mean that the stone has passed. It may have only stopped moving. If the urine remains completely obstructed, it can cause loss of kidney function or even complete destruction of the kidney. It is your responsibility to make sure X-rays and follow-ups are completed. Ultrasounds of the kidney can show blockages and the status of the kidney. Ultrasounds are not associated with any radiation and can be performed easily in a matter of minutes. °· Make changes to your daily diet as told by your health  care provider. You may be told to: °¨ Limit the amount of salt that you eat. °¨ Eat 5 or more servings of fruits and vegetables each day. °¨ Limit the amount of meat, poultry, fish, and eggs that you eat. °· Collect a 24-hour urine sample as told by your health care provider. You may need to collect another urine sample every 6-12 months. °SEEK MEDICAL CARE IF: °· You experience pain that is progressive and unresponsive to any pain medicine you have been prescribed. °SEEK IMMEDIATE MEDICAL CARE IF:  °· Pain cannot be controlled with the prescribed medicine. °· You have a fever or shaking chills. °· The severity or intensity of pain increases over 18 hours and is not relieved by pain medicine. °· You develop a new onset of abdominal pain. °· You feel faint or pass out. °· You are unable to urinate. °  °This information is not intended to replace advice given to you by your health care provider. Make sure you discuss any questions you have with your health care provider. °  °Document Released: 12/02/2005 Document Revised: 08/23/2015 Document Reviewed: 05/05/2013 °Elsevier Interactive Patient Education ©2016 [employer] ° °

## 2015-10-15 NOTE — ED Provider Notes (Signed)
CSN: [id number]   Arrival date & time 10/14/15 2251  History  By signing my name below, I, Jerry Mcneil, attest that this documentation has been prepared under the direction and in the presence of Lyndal Pulley, MD. Electronically Signed: Bethel Mcneil, ED Scribe. 10/15/2015. 12:15 AM.  Chief Complaint  Patient presents with  . Hematuria    HPI The history is provided by the patient and the spouse. No language interpreter was used.   Jerry Mcneil is a 69 y.o. male who presents to the Emergency Department complaining of hematuria with onset 2-3 hours PTA. Pt states that the stream of urine started off dark red and gradually lightened.  He denies anticoagulation apart from a daily 81 mg aspirin. Pt had similar symptoms in the past with a kidney stone in the 1990s.  Associated symptoms include bilateral lower back pain (R>L). Pt denies fever, nausea, and vomiting.   Past Medical History  Diagnosis Date  . Hyperlipidemia   . Hypertension   . Seasonal allergies   . Bursitis of left shoulder   . Bursitis of right shoulder   . Hearing impaired   . Thyroid disease     Past Surgical History  Procedure Laterality Date  . Hotel manager  . Fluid aspirated from heart    . Eye surgery    . Testicular cancer    . Parathyroidectomy      Partial  . Thyroidectomy, partial      Family History  Problem Relation Age of Onset  . Heart murmur Mother   . Cancer Father     Social History  Substance Use Topics  . Smoking status: Never Smoker   . Smokeless tobacco: Never Used  . Alcohol Use: No     Review of Systems  Constitutional: Negative for fever and chills.  Gastrointestinal: Negative for nausea and vomiting.  Genitourinary: Positive for hematuria.  Musculoskeletal: Positive for back pain.  All other systems reviewed and are negative.  Home Medications   Prior to Admission medications   Medication Sig Start Date End Date Taking? Authorizing Provider  aspirin 81  MG tablet Take 81 mg by mouth daily.   Yes Historical Provider, MD  cholecalciferol (VITAMIN D) 1000 UNITS tablet Take 2,000 Units by mouth daily.   Yes Historical Provider, MD  fluticasone (FLONASE) 50 MCG/ACT nasal spray Place 1 spray into both nostrils daily.    Historical Provider, MD  ibuprofen (ADVIL,MOTRIN) 200 MG tablet Take 600 mg by mouth every 6 (six) hours as needed.    Historical Provider, MD  loratadine (CLARITIN) 10 MG tablet Take 10 mg by mouth daily.    Historical Provider, MD  lovastatin (MEVACOR) 20 MG tablet Take 20 mg by mouth at bedtime.    Historical Provider, MD  methocarbamol (ROBAXIN) 500 MG tablet Take 2 tablets (1,000 mg total) by mouth 4 (four) times daily. 10/24/13   Renne Crigler, PA-C  oxyCODONE (OXY IR/ROXICODONE) 5 MG immediate release tablet Take 1 tablet (5 mg total) by mouth every 4 (four) hours as needed for severe pain. 10/24/13   Renne Crigler, PA-C  ranitidine (ZANTAC) 150 MG tablet Take 150 mg by mouth daily as needed for heartburn.    Historical Provider, MD  Sennosides 25 MG TABS Take 2 tablets by mouth daily as needed (for constipation).    Historical Provider, MD    Allergies  Iohexol and Shellfish-derived products  Triage Vitals: BP 133/63 mmHg  Pulse 81  Temp(Src) 97.9 F (  36.6 C) (Oral)  Resp 18  Ht 6' (1.829 m)  Wt 228 lb (103.42 kg)  BMI 30.92 kg/m2  SpO2 99%  Physical Exam  Constitutional: He is oriented to person, place, and time. He appears well-developed and well-nourished.  HENT:  Head: Normocephalic.  Eyes: EOM are normal.  Neck: Normal range of motion.  Pulmonary/Chest: Effort normal.  Abdominal: Soft. Bowel sounds are normal. He exhibits no distension and no mass. There is CVA tenderness (mild right-sided). There is no rebound and no guarding.  Mild right flank tenderness  Musculoskeletal: Normal range of motion.  Neurological: He is alert and oriented to person, place, and time.  Psychiatric: He has a normal mood and  affect.  Nursing note and vitals reviewed.   ED Course  Procedures   DIAGNOSTIC STUDIES: Oxygen Saturation is 99% on RA, normal by my interpretation.    COORDINATION OF CARE: 12:12 AM Discussed treatment plan which includes lab work and CT renal stone with pt at bedside and pt agreed to plan.  Labs Reviewed  URINALYSIS, ROUTINE W REFLEX MICROSCOPIC (NOT AT [facility]) - Abnormal; Notable for the following:    Color, Urine RED (*)    APPearance TURBID (*)    Hgb urine dipstick LARGE (*)    Protein, ur 30 (*)    Leukocytes, UA SMALL (*)    All other components within normal limits  URINE MICROSCOPIC-ADD ON - Abnormal; Notable for the following:    Bacteria, UA MANY (*)    All other components within normal limits  CBC  BASIC METABOLIC PANEL    Imaging Review Ct Renal Stone Study  10/15/2015  CLINICAL DATA:  Left flank pain with hematuria EXAM: CT ABDOMEN AND PELVIS WITHOUT CONTRAST TECHNIQUE: Multidetector CT imaging of the abdomen and pelvis was performed following the standard protocol without oral or intravenous contrast material administration. COMPARISON:  April 09, 2015 FINDINGS: Lower chest: There is scarring in the lung bases. There is no lung base edema or consolidation. There is a distal hiatal hernia. Hepatobiliary: The liver is prominent, measuring 18.9 cm in length. No focal liver lesions are identified on this noncontrast enhanced study. Gallbladder wall is not appreciably thickened. There is no biliary duct dilatation. Pancreas: No mass or inflammatory focus. Spleen: No splenic lesions are identified. Adrenals/Urinary Tract: Adrenals appear normal bilaterally. There is a cyst in the periphery of the right kidney measuring 2.1 x 1.3 cm. There is deformity in the contour of the right kidney, possibly due to the prior subcapsular hematoma. In comparison with the previous study, there is apparent thickening of the cortex in the mid kidney. There is distortion of the right renal  collecting system. There is a calculus in the right kidney mid to lower pole region measuring 0.7 x 0.3 cm. There is no ureteral calculus on the right. On the left, there is no mass. There is severe hydronephrosis. There is a calculus in the posterior mid left kidney 13 x 11 mm. There is a calculus in the proximal left ureter at the level of L3 measuring 5 x 4 mm. No other ureteral calculus is seen. The urinary bladder is midline with wall thickness within normal limits. Stomach/Bowel: There is no bowel wall or mesenteric thickening. There is lipomatous infiltration in the ileocecal valve. There is no bowel obstruction. No free air or portal venous air. Vascular/Lymphatic: There is atherosclerotic change in the aorta and iliac arteries without aneurysm. There is mild calcification at the origins of each renal artery. There  is no adenopathy in the abdomen or pelvis. Reproductive: Prostate is normal in size and contour. There is no pelvic mass. Other: Appendix appears normal. No ascites or abscess in the abdomen or pelvis. There is fat in each inguinal ring, more on the left than on the right. Musculoskeletal: There is mild scarring in the anterior mid to lower abdominal wall. No mass or fluid collection is seen in this area. There are no blastic or lytic bone lesions. There is degenerative change in the lumbar spine. No intramuscular lesions are identified. IMPRESSION: There is a 5 x 4 mm calculus in the proximal left ureter with severe hydronephrosis on the left. There is also a calculus in the posterior mid to lower pole left kidney measuring 13 x 11 mm. The right kidney has an unusual appearance, likely due to the previous subcapsular hematoma on the right. There is thickening of the cortex of the mid kidney. It must be cautioned that a renal mass on the right that is essentially of the same attenuation as the remainder the kidney could present in this manner. Given this possibility, advise nonemergent renal MR or  renal CT pre and post-contrast to further evaluate. MR would be the optimal imaging study of choice in this circumstance. There is a 7 x 3 mm calculus in the right kidney, nonobstructing. Prominent liver without focal lesion. Bowel obstruction.  No abscess.  Appendix appears normal. There is lipomatous infiltration of the ileocecal valve. Hiatal hernia. Electronically Signed   By: Bretta Bang III M.D.   On: 10/15/2015 00:58    I personally reviewed and evaluated these images and lab results as a part of my medical decision-making.    MDM   Final diagnoses:  Hematuria  Kidney stone on left side  Hydronephrosis, left    69 y.o. male presents with hematuria and pain over left flank. Tenderness is minimal and pain is well controlled. Has history of nephrolithiasis and prior lithotripsy on right. Has known old hematoma on right kidney following procedure. Has obstructing 4x5 mm stone on left. Has bacteria evident on UA but no other signs of infection. Preserved GFR and obstruction is unilateral.  Will cover with keflex pending culture for possible complicated UTI. Possible stone may require intervention given degree of hydro present, will refer to urology on an outpatient basis for consideration of intervention. Given analgesics for home pending definitive workup. Plan to follow up with PCP as needed and return precautions discussed for worsening or new concerning symptoms.   I personally performed the services described in this documentation, which was scribed in my presence. The recorded information has been reviewed and is accurate.     Lyndal Pulley, MD 10/15/15 [phone]

## 2015-10-17 LAB — URINE CULTURE: Culture: 10000

## 2015-10-18 NOTE — Progress Notes (Signed)
ED Antimicrobial Stewardship Positive Culture Follow Up   Jerry Mcneil is an 69 y.o. male who [facility] with a chief complaint of  Chief Complaint  Patient presents with  . Hematuria    Recent Results (from the past 720 hour(s))  Urine culture     Status: None   Collection Time: 10/14/15 11:43 PM  Result Value Ref Range Status   Specimen Description URINE, CLEAN CATCH  Final   Special Requests NONE  Final   Culture   Final    10,000 COLONIES/mL ENTEROCOCCUS SPECIES [facility]    Report Status 10/17/2015 FINAL  Final   Organism ID, Bacteria ENTEROCOCCUS SPECIES  Final      Susceptibility   Enterococcus species - MIC*    AMPICILLIN <=2 SENSITIVE Sensitive     LEVOFLOXACIN 0.5 SENSITIVE Sensitive     NITROFURANTOIN <=16 SENSITIVE Sensitive     VANCOMYCIN 1 SENSITIVE Sensitive     * 10,000 COLONIES/mL ENTEROCOCCUS SPECIES    Presented with hematuria and bilateral lower back pain. Found to have an obstructing kidney stone on the left.  Cephalexin was started empirically, urine culture grew Enterococcus which is not covered by cephalexin.  However the colony count was only 10K which is below the threshold for treatment.  Discussed with Jerry BerryLeisa Tapia, PA-C - she did not feel any change in ABX was warranted based on this culture result.  ED Provider: Danelle BerryLeisa Tapia, PA-C   [name], [name] 10/18/2015, 2:11 PM Infectious Diseases Pharmacist Phone# [phone]

## 2015-10-20 ENCOUNTER — Other Ambulatory Visit: Payer: Self-pay | Admitting: Urology

## 2015-10-24 ENCOUNTER — Encounter (HOSPITAL_BASED_OUTPATIENT_CLINIC_OR_DEPARTMENT_OTHER): Payer: Self-pay | Admitting: *Deleted

## 2015-10-24 NOTE — Progress Notes (Signed)
NPO AFTER MN.  ARRIVE AT 0730.  NEEDS EKG.  CURRENT LAB RESULTS IN CHART. WILL TAKE ZANTAC AM DOS W/ SIPS OF WATER AND IF NEEDED TAKE PAIN MED.

## 2015-10-30 NOTE — H&P (Signed)
History of Present Illness Jerry Mcneil is a 69 yo WM with a history of stones and hyperparathyroidism. He had a parathyroidectomy in 2009 and required a second surgery in 2014 at [facility]. His calcium has normalized.  He has had recurrent stones and had right ESWL in 0093 that was complicated by a subcapsular hematoma. He had a percutaneous nephrolithotomy in the 80's and he thinks that was on the right. He presents now with the onset Friday of last week of left flank pain and hematuria.  He was seen in the ER and was found to have a 82m left proximal stone with hydronephrosis. He was given Keflex but wasn't having fever. He continues to have moderate pain without nausea. He has not had further hematuria. He has had some increased frequency with increased fluid intake. He also has a history of [facility] in [facility] He had radiation after the surgery.   Past Medical History Problems  1. History of diabetes mellitus (Z86.39) 2. History of esophageal reflux (Z87.19) 3. History of hyperlipidemia (Z86.39) 4. History of hyperparathyroidism (Z86.39) 5. History of hypertension (Z86.79) 6. 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 History of sleep apnea (Z87.09) 8. History of stroke ((G18.29  Surgical History Problems  1. History of Cataract Surgery 2. History of Heart Surgery 3. History of Kidney Surgery 4. History of Lithotomy 5. History of Parathyroid Surgery 6. History of Percutaneous Lithotomy 7. History of Surgery Testis Incision  Current Meds 1. Aspirin 81 MG Oral Tablet Chewable;  Therapy: (Recorded:02Nov2016) to Recorded 2. Cephalexin 500 MG Oral Capsule;  Therapy: (Recorded:02Nov2016) to Recorded 3. Fluticasone Propionate 50 MCG/ACT Nasal Suspension;  Therapy: (Recorded:02Nov2016) to Recorded 4. GlipiZIDE XL 2.5 MG Oral Tablet Extended Release 24 Hour;  Therapy: (Recorded:02Nov2016) to Recorded 5. Hydrochlorothiazide  12.5 MG Oral Capsule;  Therapy: (Recorded:02Nov2016) to Recorded 6. Ibuprofen 300 MG TABS;  Therapy: (Recorded:02Nov2016) to Recorded 7. Lisinopril 5 MG Oral Tablet;  Therapy: (Recorded:02Nov2016) to Recorded 8. Loratadine 10 MG TBDP;  Therapy: (Recorded:02Nov2016) to Recorded 9. Lovastatin 20 MG Oral Tablet;  Therapy: (Recorded:02Nov2016) to Recorded 10. MetFORMIN HCl - 500 MG Oral Tablet;   Therapy: (Recorded:02Nov2016) to Recorded 11. OxyCODONE HCl - 5 MG Oral Capsule;   Therapy: (Recorded:02Nov2016) to Recorded 12. Ranitidine HCl - 150 MG Oral Capsule;   Therapy: (Recorded:02Nov2016) to Recorded 13. Vitamin B-12 TABS;   Therapy: (Recorded:02Nov2016) to Recorded 14. Vitamin D-3 TABS;   Therapy: (Recorded:02Nov2016) to Recorded  Allergies Medication  1. Shellfish-derived Products  Family History Problems  1. Family history of Deceased : Father 2. Family history of kidney stones (Z84.1) : Uncle 3. Family history of malignant neoplasm of prostate ((H37.16 : Father  Social History Problems    Caffeine use (F15.90)   Married   Never a smoker   No alcohol use   Number of children   Occupation  Review of Systems Genitourinary, constitutional, skin, eye, otolaryngeal, hematologic/lymphatic, cardiovascular, pulmonary, endocrine, musculoskeletal, gastrointestinal, neurological and psychiatric system(s) were reviewed and pertinent findings if present are noted and are otherwise negative.  Genitourinary: urinary frequency, nocturia, incontinence, weak urinary stream and hematuria.  Gastrointestinal: flank pain, but no heartburn.  ENT: sinus problems.  Endocrine: polydipsia.  Musculoskeletal: back pain.    Vitals Vital Signs [Data Includes: Last 1 Day]  Recorded: [id number]:48AM  Height: 6 ft  Weight: 230 lb  BMI Calculated: 31.19 BSA Calculated: 2.26 Blood Pressure: 125 / 74 Temperature: 97.4 F Heart Rate: 74  Physical  Exam Constitutional: Well nourished and  well developed . No acute distress.  ENT:. The ears and nose are normal in appearance.  Neck: The appearance of the neck is normal and no neck mass is present.  Pulmonary: No respiratory distress and normal respiratory rhythm and effort.  Cardiovascular: Heart rate and rhythm are normal . No peripheral edema.  Abdomen: The abdomen is soft and nontender. No masses are palpated. No CVA tenderness. No hernias are palpable. No hepatosplenomegaly noted.  Rectal: Rectal exam demonstrates normal sphincter tone, no tenderness and no masses. The prostate has no nodularity and is not tender. The left seminal vesicle is nonpalpable. The right seminal vesicle is nonpalpable. The perineum is normal on inspection.  Genitourinary: Examination of the penis demonstrates no discharge, no masses, no lesions and a normal meatus. The scrotum is without lesions. The right epididymis is non-tender. The left epididymis is palpably normal and non-tender. The right testis is absent, non-tender and without masses. The left testis is non-tender and without masses.  Lymphatics: The femoral and inguinal nodes are not enlarged or tender.  Skin: Normal skin turgor, no visible rash and no visible skin lesions.  Neuro/Psych:. Mood and affect are appropriate. Normal sensation of the perineum/perianal region (S3,4,5).    Results/Data Urine [Data Includes: Last 1 Day]   [id number]  COLOR YELLOW   APPEARANCE CLEAR   SPECIFIC GRAVITY 1.025   pH 5.5   GLUCOSE TRACE   BILIRUBIN NEGATIVE   KETONE NEGATIVE   BLOOD TRACE   PROTEIN NEGATIVE   NITRITE NEGATIVE   LEUKOCYTE ESTERASE NEGATIVE   SQUAMOUS EPITHELIAL/HPF NONE SEEN HPF  WBC 0-5 WBC/HPF  RBC 3-10 RBC/HPF  BACTERIA NONE SEEN HPF  CRYSTALS NONE SEEN HPF  CASTS NONE SEEN LPF  Yeast NONE SEEN HPF   The following images/tracing/specimen were independently visualized:  CT films and report reviewed. KUB today shows 2 left proximal stones with the largest being 5x76m. There is  a 122mLMP stone and a smaller LLP stone. There is a small RLP stone. He has some lumbar degenerative disease. There are no gas, soft tissue or bony abnormalities.  The following clinical lab reports were reviewed:  UA and outside labs reviewed. His creatinine is 1.21.    Assessment Assessed  1. Calculus of left ureter (N20.1) 2. Hydronephrosis, left (N13.30) 3. Calculus of kidney (N20.0)  He has left proximal ureteral stones with obstruction and 2 left renal stones with the largest 1359mn size.  He has a history of a right subcapsular hematoma with ESWL.   Plan Calculus of left ureter  1. Follow-up Schedule Surgery Office  Follow-up  Status: Complete  Done: [id number] KUB; Status:Resulted - Requires Verification;   Done: 0: 60VPX1062:19AM Health Maintenance  3. UA With REFLEX; [Do Not Release]; Status:Resulted - Requires Verification;   Done:  :  69SWN4627:23AM  I discussed the options for therapy including MET, ESWL, Ureteroscopy and PCNL.  After review of the options we are going to proceed with ureteroscopy with laser and he may need more than one procedure because of the bulk of the stones. I would like to fragment the renal stones as well as the ureteral stones.  I reviewed the risks of bleeding, infection, ureteral injury or stricture, need for stent or secondary procedures, thrombotic events and anesthetic complications.   Signatures Electronically signed by : JohIrine Seal.D.; Oct 18 2015 12:46PM EST

## 2015-10-31 ENCOUNTER — Encounter (HOSPITAL_BASED_OUTPATIENT_CLINIC_OR_DEPARTMENT_OTHER)
Admission: RE | Disposition: A | Discharge status: Home / Self Care | Payer: Self-pay | Source: Ambulatory Visit | Attending: Urology

## 2015-10-31 ENCOUNTER — Ambulatory Visit (HOSPITAL_BASED_OUTPATIENT_CLINIC_OR_DEPARTMENT_OTHER): Payer: Medicare Other | Admitting: Anesthesiology

## 2015-10-31 ENCOUNTER — Ambulatory Visit (HOSPITAL_BASED_OUTPATIENT_CLINIC_OR_DEPARTMENT_OTHER)
Admission: RE | Admit: 2015-10-31 | Discharge: 2015-10-31 | Disposition: A | Discharge status: Home / Self Care | Payer: Medicare Other | Source: Ambulatory Visit | Attending: Urology | Admitting: Urology

## 2015-10-31 ENCOUNTER — Encounter (HOSPITAL_BASED_OUTPATIENT_CLINIC_OR_DEPARTMENT_OTHER): Payer: Self-pay | Admitting: *Deleted

## 2015-10-31 DIAGNOSIS — Z841 Family history of disorders of kidney and ureter: Secondary | ICD-10-CM | POA: Insufficient documentation

## 2015-10-31 DIAGNOSIS — Z792 Long term (current) use of antibiotics: Secondary | ICD-10-CM | POA: Insufficient documentation

## 2015-10-31 DIAGNOSIS — E119 Type 2 diabetes mellitus without complications: Secondary | ICD-10-CM | POA: Diagnosis not present

## 2015-10-31 DIAGNOSIS — K219 Gastro-esophageal reflux disease without esophagitis: Secondary | ICD-10-CM | POA: Insufficient documentation

## 2015-10-31 DIAGNOSIS — Z8673 Personal history of transient ischemic attack (TIA), and cerebral infarction without residual deficits: Secondary | ICD-10-CM | POA: Diagnosis not present

## 2015-10-31 DIAGNOSIS — Z79899 Other long term (current) drug therapy: Secondary | ICD-10-CM | POA: Diagnosis not present

## 2015-10-31 DIAGNOSIS — E785 Hyperlipidemia, unspecified: Secondary | ICD-10-CM | POA: Diagnosis not present

## 2015-10-31 DIAGNOSIS — Z9849 Cataract extraction status, unspecified eye: Secondary | ICD-10-CM | POA: Insufficient documentation

## 2015-10-31 DIAGNOSIS — N132 Hydronephrosis with renal and ureteral calculous obstruction: Secondary | ICD-10-CM | POA: Diagnosis present

## 2015-10-31 DIAGNOSIS — Z7984 Long term (current) use of oral hypoglycemic drugs: Secondary | ICD-10-CM | POA: Diagnosis not present

## 2015-10-31 DIAGNOSIS — Z7951 Long term (current) use of inhaled steroids: Secondary | ICD-10-CM | POA: Insufficient documentation

## 2015-10-31 DIAGNOSIS — Z87442 Personal history of urinary calculi: Secondary | ICD-10-CM | POA: Diagnosis not present

## 2015-10-31 DIAGNOSIS — G4730 Sleep apnea, unspecified: Secondary | ICD-10-CM | POA: Insufficient documentation

## 2015-10-31 DIAGNOSIS — I10 Essential (primary) hypertension: Secondary | ICD-10-CM | POA: Insufficient documentation

## 2015-10-31 DIAGNOSIS — Z8547 Personal history of malignant neoplasm of testis: Secondary | ICD-10-CM | POA: Diagnosis not present

## 2015-10-31 DIAGNOSIS — E213 Hyperparathyroidism, unspecified: Secondary | ICD-10-CM | POA: Insufficient documentation

## 2015-10-31 DIAGNOSIS — Z79891 Long term (current) use of opiate analgesic: Secondary | ICD-10-CM | POA: Insufficient documentation

## 2015-10-31 DIAGNOSIS — Z791 Long term (current) use of non-steroidal anti-inflammatories (NSAID): Secondary | ICD-10-CM | POA: Diagnosis not present

## 2015-10-31 DIAGNOSIS — Z7982 Long term (current) use of aspirin: Secondary | ICD-10-CM | POA: Diagnosis not present

## 2015-10-31 HISTORY — DX: Dependence on other enabling machines and devices: Z99.89

## 2015-10-31 HISTORY — PX: HOLMIUM LASER APPLICATION: SHX5852

## 2015-10-31 HISTORY — DX: Personal history of other endocrine, nutritional and metabolic disease: Z86.39

## 2015-10-31 HISTORY — DX: Personal history of malignant neoplasm of testis: Z85.47

## 2015-10-31 HISTORY — DX: Gastro-esophageal reflux disease without esophagitis: K21.9

## 2015-10-31 HISTORY — PX: CYSTOSCOPY WITH RETROGRADE PYELOGRAM, URETEROSCOPY AND STENT PLACEMENT: SHX5789

## 2015-10-31 HISTORY — DX: Calculus of kidney: N20.0

## 2015-10-31 HISTORY — DX: Bursitis of unspecified shoulder: M75.50

## 2015-10-31 HISTORY — DX: Personal history of urinary calculi: Z87.442

## 2015-10-31 HISTORY — DX: Presence of external hearing-aid: Z97.4

## 2015-10-31 HISTORY — DX: Type 2 diabetes mellitus without complications: E11.9

## 2015-10-31 HISTORY — DX: Calculus of ureter: N20.1

## 2015-10-31 HISTORY — DX: Obstructive sleep apnea (adult) (pediatric): G47.33

## 2015-10-31 LAB — POCT I-STAT 4, (NA,K, GLUC, HGB,HCT)
GLUCOSE: 129 mg/dL — AB (ref 65–99)
HCT: 46 % (ref 39.0–52.0)
HEMOGLOBIN: 15.6 g/dL (ref 13.0–17.0)
POTASSIUM: 4.4 mmol/L (ref 3.5–5.1)
Sodium: 138 mmol/L (ref 135–145)

## 2015-10-31 LAB — GLUCOSE, CAPILLARY: GLUCOSE-CAPILLARY: 102 mg/dL — AB (ref 65–99)

## 2015-10-31 SURGERY — CYSTOURETEROSCOPY, WITH RETROGRADE PYELOGRAM AND STENT INSERTION
Anesthesia: General | Site: Ureter | Laterality: Left

## 2015-10-31 MED ORDER — PROPOFOL 10 MG/ML IV BOLUS
INTRAVENOUS | Status: DC | PRN
  Administered 2015-10-31: 250 mg via INTRAVENOUS
  Administered 2015-10-31: 50 mg via INTRAVENOUS

## 2015-10-31 MED ORDER — KETOROLAC TROMETHAMINE 30 MG/ML IJ SOLN
INTRAMUSCULAR | Status: AC
  Filled 2015-10-31: qty 1

## 2015-10-31 MED ORDER — FENTANYL CITRATE (PF) 100 MCG/2ML IJ SOLN
INTRAMUSCULAR | Status: DC | PRN
  Administered 2015-10-31 (×2): 50 ug via INTRAVENOUS

## 2015-10-31 MED ORDER — CIPROFLOXACIN IN D5W 400 MG/200ML IV SOLN
400.0000 mg | INTRAVENOUS | Status: AC
  Administered 2015-10-31: 400 mg via INTRAVENOUS
  Filled 2015-10-31: qty 200

## 2015-10-31 MED ORDER — LIDOCAINE HCL (CARDIAC) 20 MG/ML IV SOLN
INTRAVENOUS | Status: DC | PRN
  Administered 2015-10-31: 100 mg via INTRAVENOUS

## 2015-10-31 MED ORDER — ACETAMINOPHEN 650 MG RE SUPP
650.0000 mg | RECTAL | Status: DC | PRN
  Filled 2015-10-31: qty 1

## 2015-10-31 MED ORDER — PROPOFOL 10 MG/ML IV BOLUS
INTRAVENOUS | Status: AC
  Filled 2015-10-31: qty 20

## 2015-10-31 MED ORDER — CIPROFLOXACIN IN D5W 400 MG/200ML IV SOLN
INTRAVENOUS | Status: AC
  Filled 2015-10-31: qty 200

## 2015-10-31 MED ORDER — KETOROLAC TROMETHAMINE 30 MG/ML IJ SOLN
INTRAMUSCULAR | Status: DC | PRN
  Administered 2015-10-31: 15 mg via INTRAVENOUS

## 2015-10-31 MED ORDER — ONDANSETRON HCL 4 MG/2ML IJ SOLN
INTRAMUSCULAR | Status: AC
  Filled 2015-10-31: qty 2

## 2015-10-31 MED ORDER — SODIUM CHLORIDE 0.9 % IR SOLN
Status: DC | PRN
  Administered 2015-10-31: 4500 mL via INTRAVESICAL

## 2015-10-31 MED ORDER — OXYCODONE HCL 5 MG PO TABS
5.0000 mg | ORAL_TABLET | ORAL | Status: DC | PRN
  Filled 2015-10-31: qty 2

## 2015-10-31 MED ORDER — MIDAZOLAM HCL 2 MG/2ML IJ SOLN
INTRAMUSCULAR | Status: AC
  Filled 2015-10-31: qty 4

## 2015-10-31 MED ORDER — DEXAMETHASONE SODIUM PHOSPHATE 10 MG/ML IJ SOLN
INTRAMUSCULAR | Status: AC
  Filled 2015-10-31: qty 1

## 2015-10-31 MED ORDER — ACETAMINOPHEN 325 MG PO TABS
650.0000 mg | ORAL_TABLET | ORAL | Status: DC | PRN
  Filled 2015-10-31: qty 2

## 2015-10-31 MED ORDER — FENTANYL CITRATE (PF) 100 MCG/2ML IJ SOLN
INTRAMUSCULAR | Status: AC
  Filled 2015-10-31: qty 4

## 2015-10-31 MED ORDER — LACTATED RINGERS IV SOLN
INTRAVENOUS | Status: DC
  Filled 2015-10-31: qty 1000

## 2015-10-31 MED ORDER — OXYCODONE HCL 5 MG PO TABS: 5.0000 mg | ORAL_TABLET | ORAL | Status: AC | PRN

## 2015-10-31 MED ORDER — SODIUM CHLORIDE 0.9 % IJ SOLN
3.0000 mL | Freq: Two times a day (BID) | INTRAMUSCULAR | Status: DC
  Filled 2015-10-31: qty 3

## 2015-10-31 MED ORDER — IOHEXOL 350 MG/ML SOLN
INTRAVENOUS | Status: DC | PRN
  Administered 2015-10-31: 5 mL via URETHRAL

## 2015-10-31 MED ORDER — SODIUM CHLORIDE 0.9 % IV SOLN
250.0000 mL | INTRAVENOUS | Status: DC | PRN
  Filled 2015-10-31: qty 250

## 2015-10-31 MED ORDER — LACTATED RINGERS IV SOLN
INTRAVENOUS | Status: DC
  Administered 2015-10-31 (×2): via INTRAVENOUS
  Filled 2015-10-31: qty 1000

## 2015-10-31 MED ORDER — EPHEDRINE SULFATE 50 MG/ML IJ SOLN
INTRAMUSCULAR | Status: AC
  Filled 2015-10-31: qty 1

## 2015-10-31 MED ORDER — SODIUM CHLORIDE 0.9 % IJ SOLN
3.0000 mL | INTRAMUSCULAR | Status: DC | PRN
  Filled 2015-10-31: qty 3

## 2015-10-31 MED ORDER — EPHEDRINE SULFATE 50 MG/ML IJ SOLN
INTRAMUSCULAR | Status: DC | PRN
  Administered 2015-10-31: 10 mg via INTRAVENOUS

## 2015-10-31 MED ORDER — FENTANYL CITRATE (PF) 100 MCG/2ML IJ SOLN
25.0000 ug | INTRAMUSCULAR | Status: DC | PRN
  Filled 2015-10-31: qty 1

## 2015-10-31 MED ORDER — PHENAZOPYRIDINE HCL 200 MG PO TABS: 200.0000 mg | ORAL_TABLET | Freq: Three times a day (TID) | ORAL | Status: AC | PRN

## 2015-10-31 MED ORDER — LIDOCAINE HCL 2 % EX GEL
CUTANEOUS | Status: DC | PRN
  Administered 2015-10-31: 1

## 2015-10-31 SURGICAL SUPPLY — 37 items
BAG DRAIN URO-CYSTO SKYTR STRL (DRAIN) ×4 IMPLANT
BASKET LASER NITINOL 1.9FR (BASKET) IMPLANT
BASKET STONE 1.7 NGAGE (UROLOGICAL SUPPLIES) ×4 IMPLANT
BASKET ZERO TIP NITINOL 2.4FR (BASKET) IMPLANT
CANISTER SUCT LVC 12 LTR MEDI- (MISCELLANEOUS) IMPLANT
CATH URET 5FR 28IN CONE TIP (BALLOONS)
CATH URET 5FR 28IN OPEN ENDED (CATHETERS) IMPLANT
CATH URET 5FR 70CM CONE TIP (BALLOONS) IMPLANT
CLOTH BEACON ORANGE TIMEOUT ST (SAFETY) ×4 IMPLANT
ELECT REM PT RETURN 9FT ADLT (ELECTROSURGICAL)
ELECTRODE REM PT RTRN 9FT ADLT (ELECTROSURGICAL) IMPLANT
FIBER LASER FLEXIVA 1000 (UROLOGICAL SUPPLIES) IMPLANT
FIBER LASER FLEXIVA 365 (UROLOGICAL SUPPLIES) IMPLANT
FIBER LASER FLEXIVA 550 (UROLOGICAL SUPPLIES) IMPLANT
FIBER LASER TRAC TIP (UROLOGICAL SUPPLIES) ×4 IMPLANT
GLOVE SURG SS PI 8.0 STRL IVOR (GLOVE) ×4 IMPLANT
GOWN STRL REUS W/ TWL LRG LVL3 (GOWN DISPOSABLE) IMPLANT
GOWN STRL REUS W/ TWL XL LVL3 (GOWN DISPOSABLE) ×2 IMPLANT
GOWN STRL REUS W/TWL LRG LVL3 (GOWN DISPOSABLE)
GOWN STRL REUS W/TWL XL LVL3 (GOWN DISPOSABLE) ×2
GUIDEWIRE 0.038 PTFE COATED (WIRE) IMPLANT
GUIDEWIRE ANG ZIPWIRE 038X150 (WIRE) IMPLANT
GUIDEWIRE STR DUAL SENSOR (WIRE) ×8 IMPLANT
IV NS IRRIG 3000ML ARTHROMATIC (IV SOLUTION) ×4 IMPLANT
KIT BALLIN UROMAX 15FX10 (LABEL) IMPLANT
KIT BALLN UROMAX 15FX4 (MISCELLANEOUS) IMPLANT
KIT BALLN UROMAX 26 75X4 (MISCELLANEOUS)
KIT ROOM TURNOVER WOR (KITS) ×4 IMPLANT
MANIFOLD NEPTUNE II (INSTRUMENTS) ×4 IMPLANT
PACK CYSTO (CUSTOM PROCEDURE TRAY) ×4 IMPLANT
SET HIGH PRES BAL DIL (LABEL)
SHEATH ACCESS URETERAL 38CM (SHEATH) IMPLANT
SHEATH ACCESS URETERAL 54CM (SHEATH) ×4 IMPLANT
SPONGE GAUZE 4X4 12PLY STER LF (GAUZE/BANDAGES/DRESSINGS) ×4 IMPLANT
STENT URET 6FRX26 CONTOUR (STENTS) ×4 IMPLANT
TUBE CONNECTING 12'X1/4 (SUCTIONS) ×1
TUBE CONNECTING 12X1/4 (SUCTIONS) ×3 IMPLANT

## 2015-10-31 NOTE — Anesthesia Preprocedure Evaluation (Signed)
Anesthesia Evaluation  Patient identified by MRN, date of birth, ID band Patient awake    Reviewed: Allergy & Precautions, H&P , NPO status , Patient's Chart, lab work & pertinent test results  Airway Mallampati: II  TM Distance: >3 FB Neck ROM: full    Dental no notable dental hx. (+) Dental Advisory Given, Teeth Intact   Pulmonary sleep apnea and Continuous Positive Airway Pressure Ventilation ,    Pulmonary exam normal breath sounds clear to auscultation       Cardiovascular Exercise Tolerance: Good hypertension, Pt. on medications Normal cardiovascular exam Rhythm:regular Rate:Normal     Neuro/Psych negative neurological ROS  negative psych ROS   GI/Hepatic negative GI ROS, Neg liver ROS,   Endo/Other  diabetes, Well Controlled, Type 2, Oral Hypoglycemic Agents  Renal/GU negative Renal ROS  negative genitourinary   Musculoskeletal   Abdominal   Peds  Hematology negative hematology ROS (+)   Anesthesia Other Findings   Reproductive/Obstetrics negative OB ROS                             Anesthesia Physical Anesthesia Plan  ASA: III  Anesthesia Plan: General   Post-op Pain Management:    Induction: Intravenous  Airway Management Planned: LMA  Additional Equipment:   Intra-op Plan:   Post-operative Plan:   Informed Consent: I have reviewed the patients History and Physical, chart, labs and discussed the procedure including the risks, benefits and alternatives for the proposed anesthesia with the patient or authorized representative who has indicated his/her understanding and acceptance.   Dental Advisory Given  Plan Discussed with: CRNA and Surgeon  Anesthesia Plan Comments:         Anesthesia Quick Evaluation

## 2015-10-31 NOTE — Brief Op Note (Signed)
10/31/2015  10:01 AM  PATIENT:  Jerry Mcneil  69 y.o. male  PRE-OPERATIVE DIAGNOSIS:  LEFT PROXIMAL URETERAL AND LEFT RENAL STONES  POST-OPERATIVE DIAGNOSIS:  LEFT PROXIMAL URETERAL AND LEFT RENAL STONES  PROCEDURE:  Procedure(s): CYSTOSCOPY WITH LEFT RETROGRADE LEFT URETEROSCOPY AND LEFT  STENT PLACEMENT (Left) WITH HOLMIUM LASER                     (Left)  SURGEON:  Surgeon(s) and Role:    * Bjorn PippinJohn Gabrial Domine, MD - Primary  PHYSICIAN ASSISTANT:   ASSISTANTS: none   ANESTHESIA:   general  EBL:     BLOOD ADMINISTERED:none  DRAINS: left 6 x 26 JJ stent   LOCAL MEDICATIONS USED:  LIDOCAINE  and Amount: 10 ml  SPECIMEN:  Source of Specimen:  left renal stone  DISPOSITION OF SPECIMEN:  to patient to bring to the office  COUNTS:  YES  TOURNIQUET:  * No tourniquets in log *  DICTATION: .Other Dictation: Dictation Number [id number]  PLAN OF CARE: Discharge to home after PACU  PATIENT DISPOSITION:  PACU - hemodynamically stable.   Delay start of Pharmacological VTE agent (>24hrs) due to surgical blood loss or risk of bleeding: not applicable

## 2015-10-31 NOTE — Anesthesia Postprocedure Evaluation (Signed)
  Anesthesia Post-op Note  Patient: Jerry Mcneil  Procedure(s) Performed: Procedure(s) (LRB): CYSTOSCOPY WITH LEFT RETROGRADE LEFT URETEROSCOPY AND LEFT  STENT PLACEMENT (Left) WITH HOLMIUM LASER                     (Left)  Patient Location: PACU  Anesthesia Type: General  Level of Consciousness: awake and alert   Airway and Oxygen Therapy: Patient Spontanous Breathing  Post-op Pain: mild  Post-op Assessment: Post-op Vital signs reviewed, Patient's Cardiovascular Status Stable, Respiratory Function Stable, Patent Airway and No signs of Nausea or vomiting  Last Vitals:  Filed Vitals:   10/31/15 1130  BP: 112/63  Pulse: 78  Temp:   Resp: 16    Post-op Vital Signs: stable   Complications: No apparent anesthesia complications

## 2015-10-31 NOTE — Discharge Instructions (Addendum)
Ureteral Stent Implantation Ureteral stent implantation is the implantation of a soft plastic tube with multiple holes into the tube that drains urine from your kidney to your bladder (ureter). The stent helps drain your kidney when there is a blockage of the flow of urine in your ureter. The stent has a coil on each end to keep it from falling out. One end stays in the kidney. The other end stays in the bladder. It is most often taken out after any blockage has been removed or your ureter has healed. Short-term stents have a string attached to make removal quite easy. Removal of a short-term stent can be done in your health care provider's office or by you at home. Long-term stents need to be changed every few months. LET [facility]ARE PROVIDER KNOW ABOUT:  Any allergies you have.  All medicines you are taking, including vitamins, herbs, eye drops, creams, and over-the-counter medicines. Previous problems you or members of your family have had with the use of anesthetics. Post Anesthesia Home Care Instructions  Activity: Get plenty of rest for the remainder of the day. A responsible adult should stay with you for 24 hours following the procedure.  For the next 24 hours, DO NOT: -Drive a car -Advertising copywriterperate machinery -Drink alcoholic beverages -Take any medication unless instructed by your physician -Make any legal decisions or sign important papers.  Meals: Start with liquid foods such as gelatin or soup. Progress to regular foods as tolerated. Avoid greasy, spicy, heavy foods. If nausea and/or vomiting occur, drink only clear liquids until the nausea and/or vomiting subsides. Call your physician if vomiting continues.  Special Instructions/Symptoms: Your throat may feel dry or sore from the anesthesia or the breathing tube placed in your throat during surgery. If this causes discomfort, gargle with warm salt water. The discomfort should disappear within 24 hours.  If you had a scopolamine  patch placed behind your ear for the management of post- operative nausea and/or vomiting:  1. The medication in the patch is effective for 72 hours, after which it should be removed.  Wrap patch in a tissue and discard in the trash. Wash hands thoroughly with soap and water. 2. You may remove the patch earlier than 72 hours if you experience unpleasant side effects which may include dry mouth, dizziness or visual disturbances. 3. Avoid touching the patch. Wash your hands with soap and water after contact with the patch.      Any blood disorders you have.  Previous surgeries you have had.  Medical conditions you have. RISKS AND COMPLICATIONS Generally, ureteral stent implantation is a safe procedure. However, as with any procedure, complications can occur. Possible complications include:  Movement of the stent away from where it was originally placed (migration). This may affect the ability of the stent to properly drain your kidney. If migration of the stent occurs, the stent may need to be replaced or repositioned.  Perforation of the ureter.  Infection. BEFORE THE PROCEDURE  You may be asked to wash your genital area with sterile soap the morning of your procedure.  You may be given an oral antibiotic which you should take with a sip of water as prescribed by your health care provider.  You may be asked to not eat or drink for 8 hours before the surgery. PROCEDURE  First you will be given an anesthetic so you do not feel pain during the procedure.  Your health care provider will insert a special lighted instrument called a  cystoscope into your bladder. This allows your health care provider to see the opening to your ureter.  A thin wire is carefully threaded into your bladder and up the ureter. The stent is inserted over the wire and the wire is then removed.  Your bladder will be emptied of urine. AFTER THE PROCEDURE You will be taken to a recovery room until it is okay  for you to go home.   This information is not intended to replace advice given to you by your health care provider. Make sure you discuss any questions you have with your health care provider.   Document Released: 11/29/2000 Document Revised: 12/07/2013 Document Reviewed: 06/16/2015 Elsevier Interactive Patient Education 2016 [employer]  CYSTOSCOPY HOME CARE INSTRUCTIONS  Activity: Rest for the remainder of the day.  Do not drive or operate equipment today.  You may resume normal activities in one to two days as instructed by your physician.   Meals: Drink plenty of liquids and eat light foods such as gelatin or soup this evening.  You may return to a normal meal plan tomorrow.  Return to Work: You may return to work in one to two days or as instructed by your physician.  Special Instructions / Symptoms: Call your physician if any of these symptoms occur:   -persistent or heavy bleeding  -bleeding which continues after first few urination  -large blood clots that are difficult to pass  -urine stream diminishes or stops completely  -fever equal to or higher than 101 degrees Farenheit.  -cloudy urine with a strong, foul odor  -severe pain  Females should always wipe from front to back after elimination.  You may feel some burning pain when you urinate.  This should disappear with time.  Applying moist heat to the lower abdomen or a hot tub bath may help relieve the pain. \  Please bring your stone to the office for analysis.    Patient Signature:  ________________________________________________________  Nurse's Signature:  ________________________________________________________

## 2015-10-31 NOTE — Interval H&P Note (Signed)
History and Physical Interval Note:  10/31/2015 8:40 AM  Jerry Mcneil  has presented today for surgery, with the diagnosis of LEFT PROXIMAL URETERAL AND LEFT RENAL STONES  The various methods of treatment have been discussed with the patient and family. After consideration of risks, benefits and other options for treatment, the patient has consented to  Procedure(s): CYSTOSCOPY WITH LEFT RETROGRADE LEFT URETEROSCOPY AND LEFT  STENT PLACEMENT (Left) WITH HOLMIUM LASER                     (Left) as a surgical intervention .  The patient's history has been reviewed, patient examined, no change in status, stable for surgery.  I have reviewed the patient's chart and labs.  Questions were answered to the patient's satisfaction.     [name] J

## 2015-10-31 NOTE — Transfer of Care (Signed)
Immediate Anesthesia Transfer of Care Note  Patient: Jerry Mcneil  Procedure(s) Performed: Procedure(s): CYSTOSCOPY WITH LEFT RETROGRADE LEFT URETEROSCOPY AND LEFT  STENT PLACEMENT (Left) WITH HOLMIUM LASER                     (Left)  Patient Location: PACU  Anesthesia Type:General  Level of Consciousness: sedated  Airway & Oxygen Therapy: Patient Spontanous Breathing and Patient connected to nasal cannula oxygen  Post-op Assessment: Report given to RN  Post vital signs: Reviewed and stable  Last Vitals:  Filed Vitals:   10/31/15 0744  BP: 127/70  Pulse: 76  Temp: 36.6 C  Resp: 15    Complications: No apparent anesthesia complications

## 2015-10-31 NOTE — Anesthesia Procedure Notes (Signed)
Procedure Name: LMA Insertion Date/Time: 10/31/2015 9:04 AM Performed by: Maris BergerENENNY, Armarion Greek T Pre-anesthesia Checklist: Patient identified, Emergency Drugs available, Suction available and Patient being monitored Patient Re-evaluated:Patient Re-evaluated prior to inductionOxygen Delivery Method: Circle System Utilized Preoxygenation: Pre-oxygenation with 100% oxygen Intubation Type: IV induction Ventilation: Mask ventilation without difficulty LMA: LMA inserted LMA Size: 5.0 Number of attempts: 1 Airway Equipment and Method: Bite block Placement Confirmation: positive ETCO2 Dental Injury: Teeth and Oropharynx as per pre-operative assessment

## 2015-11-01 ENCOUNTER — Encounter (HOSPITAL_BASED_OUTPATIENT_CLINIC_OR_DEPARTMENT_OTHER): Payer: Self-pay | Admitting: Urology

## 2015-11-01 NOTE — Op Note (Signed)
NAMFerd Mcneil:  Jerry Mcneil, Jerry Mcneil             ACCOUNT NO.:  [id number]  MEDICAL RECORD NO.:  [id number]  LOCATION:                               [facility]  PHYSICIAN:  Excell SeltzerJohn J. Annabell HowellsWrenn, M.D.    DATE OF BIRTH:  12-24-1945  DATE OF PROCEDURE:  10/31/2015 DATE OF DISCHARGE:  10/31/2015                              OPERATIVE REPORT   PROCEDURE: 1. Cystoscopy with left retrograde pyelogram and interpretation. 2. Left ureteroscopy with laser for a 5 mm left proximal stone. 3. Left ureteroscopy and lasertripsy of a 13 mm left lower pole stone. 4. Left ureteral stent insertion.  PREOPERATIVE DIAGNOSIS:  Left proximal ureter and left lower pole renal stones.  POSTOPERATIVE DIAGNOSIS:  Left proximal ureter and left lower pole renal stones.  SURGEON:  Excell SeltzerJohn J. Annabell HowellsWrenn, M.D.  ANESTHESIA:  General.  SPECIMEN:  Stone fragments.  DRAINS:  A 6-French x 26 cm double-J stent.  BLOOD LOSS:  Minimal.  COMPLICATIONS:  None.  INDICATIONS:  [name] is a 69 year old white male who has a history of stones with prior hyperparathyroidism.  He presented to the emergency room in late October with a 5 mm obstructing left proximal ureteral stone with a 13 mm left lower pole stone.  His history was significant for prior lithotripsy on the right with a subcapsular hematoma.  With that history in mind, ureteroscopy was selected also because it was felt that both stones most certainly dealt with in a single setting.  FINDINGS OF PROCEDURE:  He was given Cipro.  He was taken to the operating room where general anesthetic was induced.  He was placed in lithotomy position.  His perineum and genitalia were prepped with Betadine solution.  He was draped in usual sterile fashion.  Cystoscopy was performed using the 23-French scope and 30-degree lens. Examination revealed a normal urethra.  The external sphincter was intact.  The prostatic urethra was approximately 3 cm in length with bilobar hyperplasia without  significant obstruction.  Examination of bladder revealed mild trabeculation, but otherwise normal mucosa.  The ureteral orifices were unremarkable in the normal anatomic position.  The left ureteral orifice was cannulated with 5-French open-end catheter and contrast was instilled.  This revealed a fairly narrow intramural and distal ureter.  There was a normal caliber ureter up to a filling defect at L3 consistent with the stone. Proximal to the stone, there was hydronephrosis with a filling defect in the lower pole consistent with a stone seen on CT scan.  Once retrograde pyelography was complete, a guidewire was passed to the kidney without difficulty and the cystoscope was removed.  I then passed the digital access sheath inner core to the level of the stone.  It passed easily.  At this point though I felt because of location of stone, a safety wire would be important to have in place so the cystoscope was reinserted and a second wire was passed along the ureter to the kidney.  I then attempted to pass the 55 cm 14-French digital access sheath over the working wire, but I was unable to get it by the intramural ureter.  At this point, the access sheath was removed and a dual-lumen digital  flexible ureteroscope was inserted over the working wire.  This was advanced fluoroscopically to the level of the stone in the proximal ureter and then the working wire was then removed.  A 200 micron TracTip laser fiber was then inserted through the laser port, this was initially set on 0.5 watts at 20 hertz.  The stone in the proximal ureter was identified and was engaged and broken into small fragments.  These fragments flushed down the ureter as I was working. Eventually, I was able to fragment the stone in its entirety and passed the scope into the internal collecting system.  The internal collecting system was inspected in its entirety.  I saw no obvious stones in the upper and mid  calices; however, in the lower calyx, the large 13 mm stone was identified as noted on CT and in another calyx, there were also couple of 4 mm stones. I then engaged the 13 mm stone with the laser set on 0.8 watts and 50 hertz and it was reduced to very small fragments less than 3 mm.  Once this stone had been fragmented, I turned my attention to the other 2 small stones, one of which was fragmented and 1 was then trapped with a NGage basket and removed intact as our specimen.  Because of the difficulty in placing the access sheath at this point, I felt the most appropriate option since the stones had fragmented readily was to insert a stent and allow for passage of the stone fragments along the stent.  The cystoscope was reinserted over the guidewire and a 6-French 26-cm double-J stent without string was inserted to the kidney under fluoroscopic guidance.  The wire was removed leaving good coil in the kidney and good coil in the bladder.  The bladder was then drained with removal of few more stone fragments. The cystoscope was removed and urethra was instilled with 10 mL of 2% lidocaine jelly.  The patient was then taken down from lithotomy position.  His anesthetic was reversed.  He was moved to recovery in stable condition.  The stone was given to the patient's wife to bring to the office. There were no complications.     Excell Seltzer. Annabell Howells, M.D.    JJW/MEDQ  D:  10/31/2015  T:  10/31/2015  Job:  [id number]

## 2016-02-28 ENCOUNTER — Other Ambulatory Visit: Payer: Self-pay | Admitting: Family Medicine

## 2016-02-28 DIAGNOSIS — R911 Solitary pulmonary nodule: Secondary | ICD-10-CM

## 2016-03-11 ENCOUNTER — Ambulatory Visit
Admission: RE | Admit: 2016-03-11 | Discharge: 2016-03-11 | Disposition: A | Discharge status: Home / Self Care | Payer: Medicare Other | Source: Ambulatory Visit | Attending: Family Medicine | Admitting: Family Medicine

## 2016-03-11 DIAGNOSIS — R911 Solitary pulmonary nodule: Secondary | ICD-10-CM

## 2016-11-25 IMAGING — CT CT CHEST W/O CM
3 of 4 series · 17 of 30 positions shown, 19 images · non-contrast
Comparison: CT of the abdomen and pelvis 12/29/2015 and 10/15/2015.

CLINICAL DATA: Followup pulmonary nodule

EXAM:
CT CHEST WITHOUT CONTRAST
TECHNIQUE: Multidetector CT imaging of the chest was performed following the
standard protocol without IV contrast.

[Series 3: chest w/o · axial · non-contrast · 0.78mm/px · z∈[-218,+2]mm · 5 of 66 slices shown, 7 images]
[im 11/66  mediastinal]
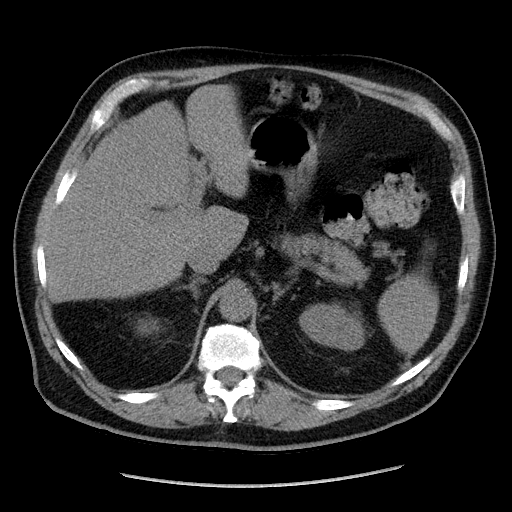
[im 11/66  lung]
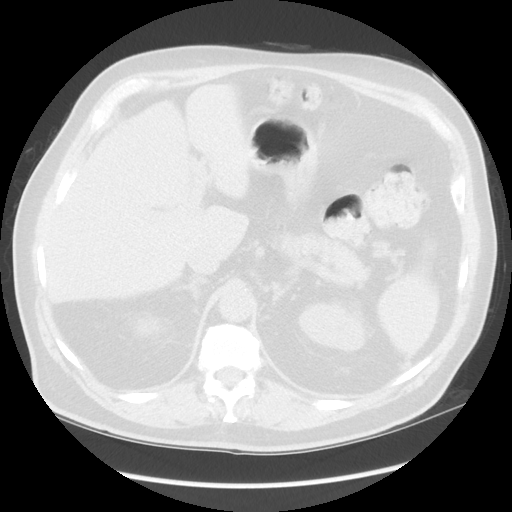
[im 22/66  lung]
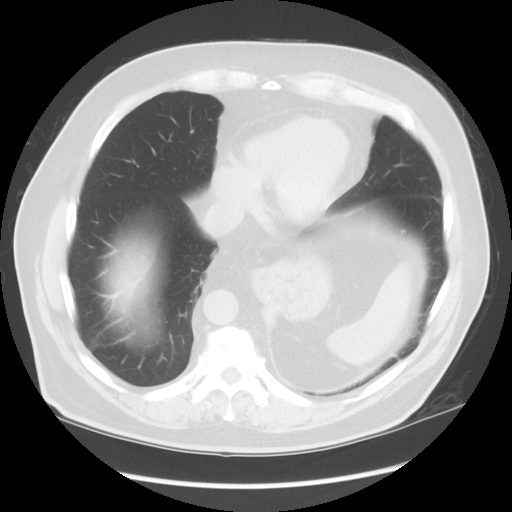
[im 33/66  lung]
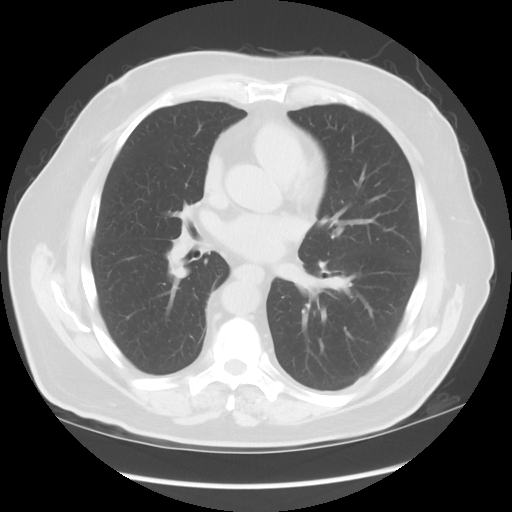
[im 44/66  lung]
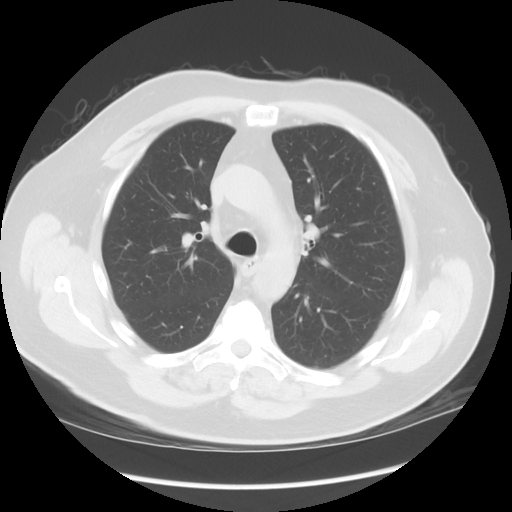
[im 55/66  mediastinal]
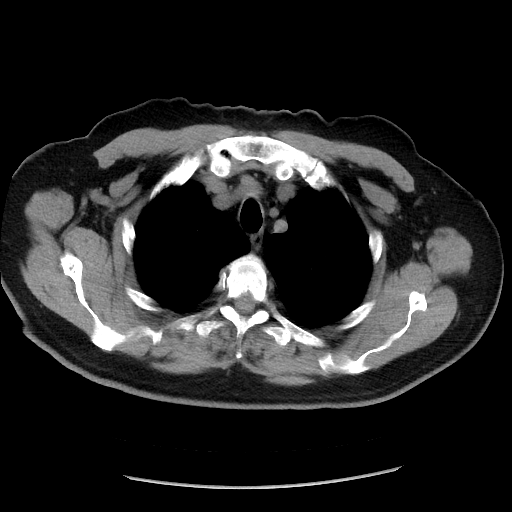
[im 55/66  lung]
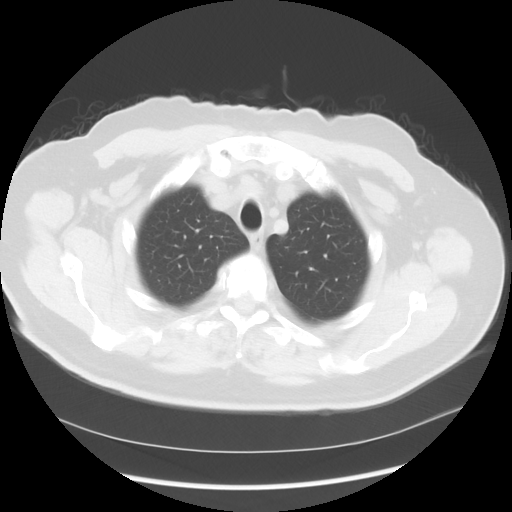

[Series 4: lung windows · axial · 0.78mm/px · z∈[-202,-8]mm · 4 of 66 slices shown]
[im 14/66  lung]
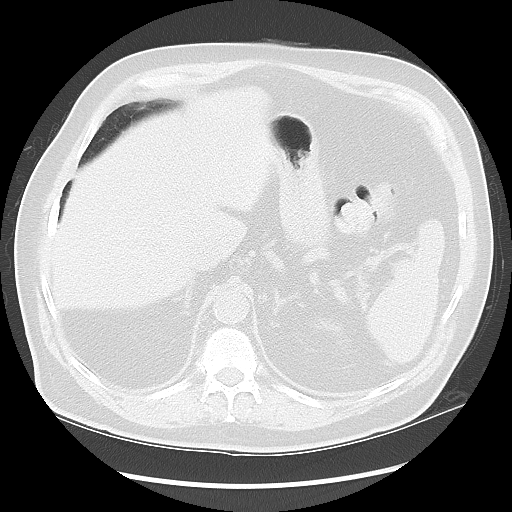
[im 27/66  lung]
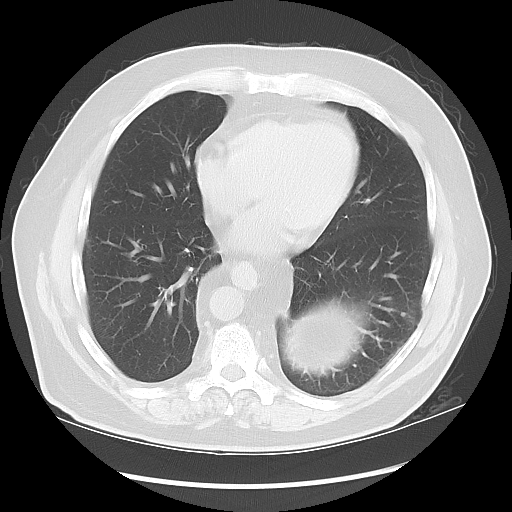
[im 40/66  lung]
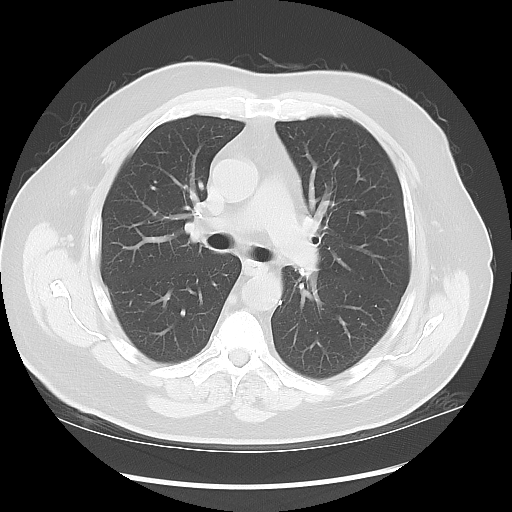
[im 53/66  lung]
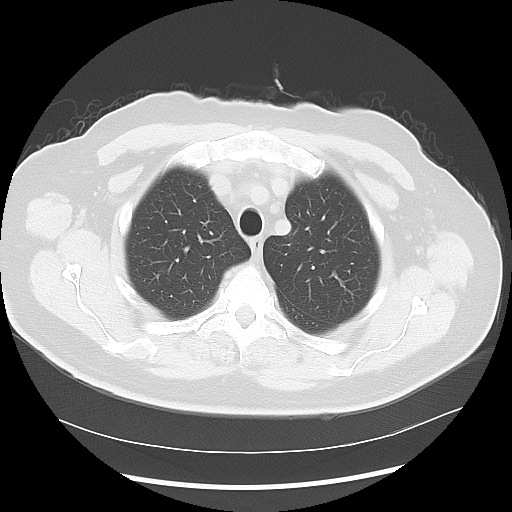

[Series 602: sagittal body · sagittal · 0.78mm/px · 8 of 160 slices shown]
[im 12/160  mediastinal]
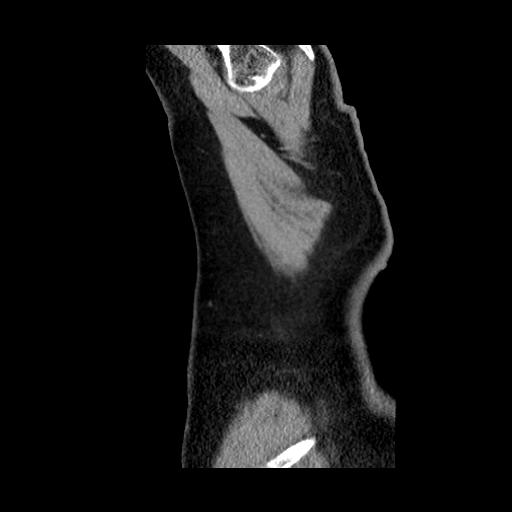
[im 35/160  mediastinal]
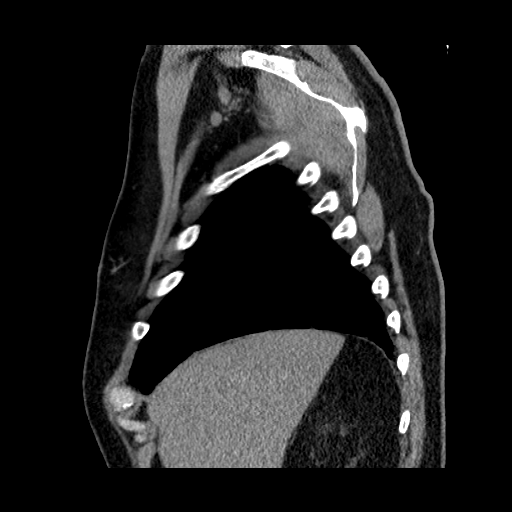
[im 57/160  mediastinal]
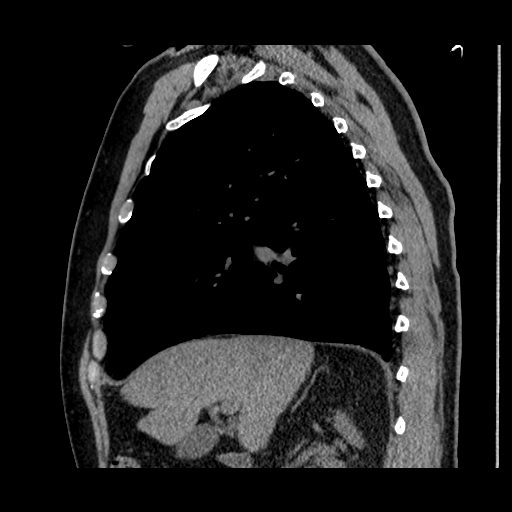
[im 69/160  mediastinal]
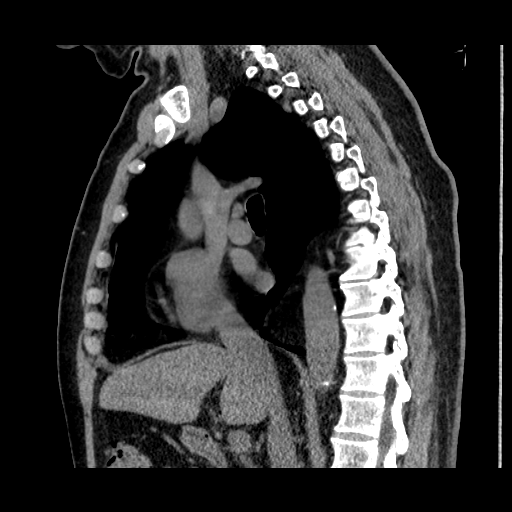
[im 91/160  mediastinal]
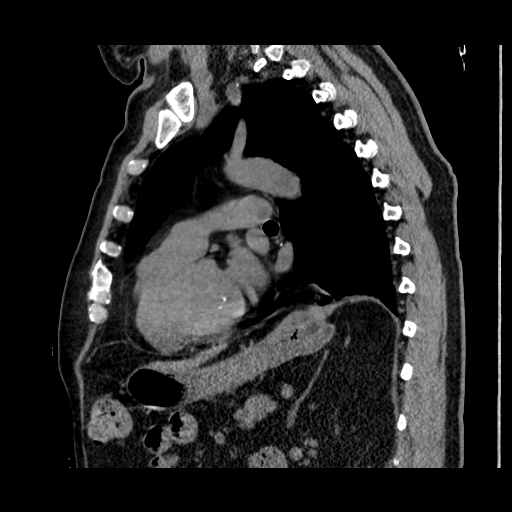
[im 103/160  mediastinal]
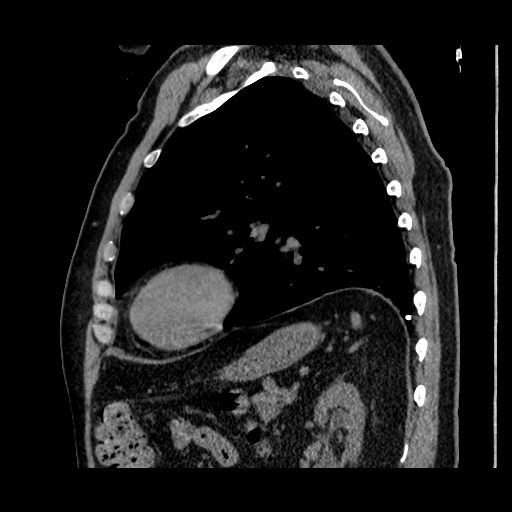
[im 125/160  mediastinal]
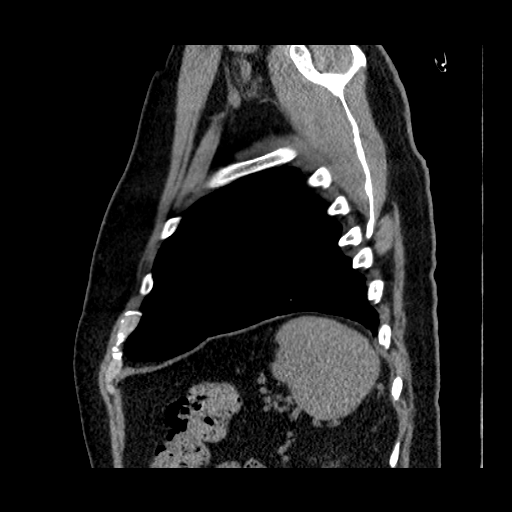
[im 148/160  mediastinal]
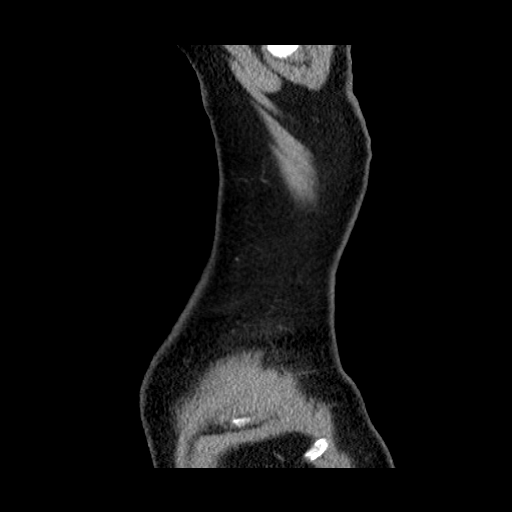

[17 of 30 positions shown; findings below may reference images not displayed]

FINDINGS: Mediastinum/Nodes: Heart is normal size. Aorta is normal caliber. No
mediastinal, hilar, or axillary adenopathy. Small calcified
mediastinal and left hilar lymph nodes compatible with old
granulomatous disease

Lungs/Pleura: 4 mm nodule at the left lung base on image 40, stable
since prior study. Calcified granuloma posteriorly in the left lung
base on image 43, stable. No additional pulmonary nodules or
confluent airspace opacities. No pleural effusions.

Upper abdomen: Imaging into the upper abdomen shows no acute
findings.

Musculoskeletal: Chest wall soft tissues are unremarkable. No acute
bony abnormality or focal bone lesion.
IMPRESSION: Stable 4 mm left lower lobe pulmonary nodule. No follow-up needed if
patient is low-risk. Non-contrast chest CT can be considered in 12
months if patient is high-risk. This recommendation follows the
consensus statement: Guidelines for Management of Incidental
Pulmonary Nodules Detected on CT Images:From the [HOSPITAL]
6219; published online before print (10.1148/radiol.1102080851).

Evidence of old granulomatous disease.
